# Patient Record
Sex: Female | Born: 1960 | Race: White | Hispanic: No | Marital: Married | Smoking: Never smoker
Health system: Southern US, Community
[De-identification: ages and names within clinical notes are randomized; demographics above are authoritative.]

## PROBLEM LIST (undated history)

## (undated) DIAGNOSIS — R109 Unspecified abdominal pain: Secondary | ICD-10-CM

## (undated) DIAGNOSIS — A029 Salmonella infection, unspecified: Secondary | ICD-10-CM

## (undated) DIAGNOSIS — F419 Anxiety disorder, unspecified: Secondary | ICD-10-CM

## (undated) DIAGNOSIS — N809 Endometriosis, unspecified: Secondary | ICD-10-CM

## (undated) HISTORY — PX: NASAL SINUS SURGERY: SHX719

## (undated) HISTORY — DX: Salmonella infection, unspecified: A02.9

## (undated) HISTORY — DX: Endometriosis, unspecified: N80.9

---

## 1997-11-23 ENCOUNTER — Other Ambulatory Visit: Admission: RE | Admit: 1997-11-23 | Discharge: 1997-11-23 | Payer: Self-pay | Admitting: *Deleted

## 1998-08-02 ENCOUNTER — Encounter: Payer: Self-pay | Admitting: *Deleted

## 1998-08-02 ENCOUNTER — Ambulatory Visit (HOSPITAL_COMMUNITY): Admission: RE | Admit: 1998-08-02 | Discharge: 1998-08-02 | Payer: Self-pay | Admitting: *Deleted

## 1998-12-04 ENCOUNTER — Other Ambulatory Visit: Admission: RE | Admit: 1998-12-04 | Discharge: 1998-12-04 | Payer: Self-pay | Admitting: *Deleted

## 1999-11-26 ENCOUNTER — Other Ambulatory Visit: Admission: RE | Admit: 1999-11-26 | Discharge: 1999-11-26 | Payer: Self-pay | Admitting: *Deleted

## 2000-11-28 ENCOUNTER — Other Ambulatory Visit: Admission: RE | Admit: 2000-11-28 | Discharge: 2000-11-28 | Payer: Self-pay | Admitting: *Deleted

## 2001-11-30 ENCOUNTER — Other Ambulatory Visit: Admission: RE | Admit: 2001-11-30 | Discharge: 2001-11-30 | Payer: Self-pay | Admitting: Obstetrics and Gynecology

## 2002-12-14 ENCOUNTER — Other Ambulatory Visit: Admission: RE | Admit: 2002-12-14 | Discharge: 2002-12-14 | Payer: Self-pay | Admitting: Obstetrics and Gynecology

## 2003-01-05 ENCOUNTER — Encounter (INDEPENDENT_AMBULATORY_CARE_PROVIDER_SITE_OTHER): Payer: Self-pay

## 2003-01-05 ENCOUNTER — Ambulatory Visit (HOSPITAL_COMMUNITY): Admission: RE | Admit: 2003-01-05 | Discharge: 2003-01-05 | Payer: Self-pay | Admitting: Obstetrics and Gynecology

## 2004-01-02 ENCOUNTER — Other Ambulatory Visit: Admission: RE | Admit: 2004-01-02 | Discharge: 2004-01-02 | Payer: Self-pay | Admitting: Obstetrics and Gynecology

## 2005-01-08 ENCOUNTER — Other Ambulatory Visit: Admission: RE | Admit: 2005-01-08 | Discharge: 2005-01-08 | Payer: Self-pay | Admitting: Obstetrics and Gynecology

## 2005-01-11 ENCOUNTER — Encounter: Admission: RE | Admit: 2005-01-11 | Discharge: 2005-01-11 | Payer: Self-pay | Admitting: Obstetrics and Gynecology

## 2005-09-20 ENCOUNTER — Ambulatory Visit: Payer: Self-pay | Admitting: Internal Medicine

## 2005-09-27 ENCOUNTER — Ambulatory Visit: Payer: Self-pay | Admitting: Internal Medicine

## 2007-12-09 ENCOUNTER — Encounter: Payer: Self-pay | Admitting: Internal Medicine

## 2008-04-27 ENCOUNTER — Ambulatory Visit: Payer: Self-pay | Admitting: Internal Medicine

## 2008-04-27 DIAGNOSIS — F411 Generalized anxiety disorder: Secondary | ICD-10-CM | POA: Insufficient documentation

## 2009-08-03 ENCOUNTER — Encounter: Payer: Self-pay | Admitting: Internal Medicine

## 2010-03-30 ENCOUNTER — Ambulatory Visit: Payer: Self-pay | Admitting: Internal Medicine

## 2010-04-03 ENCOUNTER — Telehealth: Payer: Self-pay | Admitting: Internal Medicine

## 2010-06-06 ENCOUNTER — Ambulatory Visit: Payer: Self-pay | Admitting: Internal Medicine

## 2010-06-06 DIAGNOSIS — M25559 Pain in unspecified hip: Secondary | ICD-10-CM | POA: Insufficient documentation

## 2010-06-06 DIAGNOSIS — R109 Unspecified abdominal pain: Secondary | ICD-10-CM | POA: Insufficient documentation

## 2010-06-11 ENCOUNTER — Telehealth: Payer: Self-pay | Admitting: Internal Medicine

## 2010-08-15 ENCOUNTER — Ambulatory Visit (HOSPITAL_COMMUNITY)
Admission: RE | Admit: 2010-08-15 | Discharge: 2010-08-15 | Payer: Self-pay | Source: Home / Self Care | Attending: Gynecologic Oncology | Admitting: Gynecologic Oncology

## 2010-08-15 ENCOUNTER — Ambulatory Visit
Admission: RE | Admit: 2010-08-15 | Discharge: 2010-08-15 | Payer: Self-pay | Source: Home / Self Care | Attending: Gynecologic Oncology | Admitting: Gynecologic Oncology

## 2010-08-16 NOTE — Consult Note (Signed)
Kathy Floyd, Kathy NO.:  [id number]  MEDICAL RECORD NO.:  [id number]          PATIENT TYPE:  OUT  LOCATION:  XRAY                         FACILITY:  [facility]  PHYSICIAN:  Laurette Schimke, MD     DATE OF BIRTH:  May 25, 1961  DATE OF CONSULTATION:  08/15/2010 DATE OF DISCHARGE:                                CONSULTATION   REASON FOR CONSULTATION:  Consult was requested by Dr. Henderson Cloud for management of a pelvic mass.  HISTORY OF PRESENT ILLNESS:  This is a 50 year old gravida 1, para 1, who noted the onset of left hip pain for which she sought evaluation. She was referred to an orthopedic surgeon and underwent an MRI of the pelvis, which demonstrated the presence of uterine fibroids, the largest of which measured 2.6 cm.  At that time, free and loculated fluid was noted within the pelvis and a pelvic sonogram was recommended.  Such, a sonogram was obtained on June 11, 2010, and at that time was notable for the presence of a 2.79 x 1.44 cm, left lateral posterior fibroid. The adnexa were small, the right ovary measuring 5.7 cm and the left ovary measuring 1.8 cm in greatest dimension.  There is no comment regarding presence of fluid.  A repeat ultrasound was ordered on July 05, 2010, and that ultrasound demonstrated a right simple cyst and 2 complex right ovarian masses, measuring 1.6 and 1.96 cm.  A CA125 was obtained and returned a value of 50, and the patient was referred to the office.  During this visit, we were able to facilitate another ultrasound, and this time on the ultrasound, the right adnexa was not appreciated.  Of note, the ultrasound in December was notable for a complex mass in the right ovary.  The left ovary demonstrated an amorphous mass/cyst with linear septations, questionable for an endometrioma.  Ms. Kathy Floyd denies any weight loss.  There is no bloating.  No changes in appetite.  No early satiety, alternating diarrhea, constipation,  or vaginal bleeding.  She also denies use of any other agents that may have affected the efficacy of her oral contraceptive pills and allowed for development of functional cyst.  Of note, she has been on continuous oral contraceptive pills for a long time for management of severe dysmenorrhea and symptoms associated with menses.  PAST MEDICAL HISTORY:  Anxiety for 5 years.  PAST SURGICAL HISTORY:  Denies.  PAST GYN HISTORY:  Menarche at age of 4 with regular menses.  Oral contraceptive pills since the age of 24; there was a break of about 2 years.  She is gravida 1, para 74, has a 30 year old son who is alive and well.  SOCIAL HISTORY:  She denies tobacco and reports occasional alcohol use. This is her second marriage and she is employed as a Radio producer.  MEDICATIONS:  Yasmin, Toprol, and lorazepam.  SCREENING HISTORY:  Mammogram in June 2011, within normal limits.  ALLERGIES:  SULFA, which causes hives.  REVIEW OF SYSTEMS:  Ten-point review of systems pertinent only as noted above.  PHYSICAL EXAMINATION:  GENERAL:  Well-developed female in no acute distress. VITAL SIGNS:  Weight 150 pounds,  height 5 feet 3 inches, blood pressure 118/70, pulse of 68. HEART:  Regular rate and rhythm. CHEST:  Clear to auscultation. BACK:  No CVA tenderness. ABDOMEN:  Soft, nontender, without any masses and no ascites or palpable omental cake. PELVIC:  Normal external genitalia, Bartholin's, urethra and Skene's. Normal-appearing cervix.  Mobile uterus.  No nodularity within the cul- de-sac.  No tenderness on pelvic examination. RECTAL:  Good anal sphincter tone without any masses.  IMPRESSION:  Ms. Kathy Floyd is a 50 year old on continuous oral contraceptive pills with CA125 elevated to a level of 50 and imaging which may be a red herring.  Of note, the imaging identifies adnexal masses that disappear each month.  The last ultrasound finding was suspicious of the presence of an  endometrioma and this may explain the minimal elevation of CA125.  As such, the recommendation was to obtain an MRI, which will be ordered.  If the abnormality on the left adnexa persists, then at minimum, a laparoscopic USO may be indicated.  Ms. Kathy Floyd was counseled regarding the benefits of maintenance of her ovaries with respect to quality of life and also length of life.  All of her questions were answered and our discussion was recorded by her stepmother.  We will contact her after the pelvic MRI, specific for soft tissue, to discuss further plan.     Laurette Schimke, MD     WB/MEDQ  D:  08/15/2010  T:  08/15/2010  Job:  [id number]  cc:   Telford Nab, R.N. [address] [place], [place] [address]  Carrington Clamp, M.D. Fax: [phone]  Electronically Signed by Laurette Schimke MD on 08/16/2010 02:38:37 PM

## 2010-08-21 ENCOUNTER — Ambulatory Visit (HOSPITAL_COMMUNITY)
Admission: RE | Admit: 2010-08-21 | Discharge: 2010-08-21 | Payer: Self-pay | Source: Home / Self Care | Attending: Gynecologic Oncology | Admitting: Gynecologic Oncology

## 2010-08-30 NOTE — Progress Notes (Signed)
Summary: Refill Request  Phone Note Refill Request Call back at Work Phone [phone] Message from:  Patient on June 11, 2010 10:51 AM  Refills Requested: Medication #1:  DICYCLOMINE HCL 20 MG TABS 1 as needed   Dosage confirmed as above?Dosage Confirmed   Supply Requested: 3 months   Last Refilled: 05/10/2008   Notes: 4 refills  Medication #2:  TRIAMCINOLONE ACETONIDE 0.1 % CREA use twice daily as needed.   Dosage confirmed as above?Dosage Confirmed   Supply Requested: 3 months   Last Refilled: 04/27/2008   Notes: 4 refills Patient would like to pick up paper rx's.   Next Appointment Scheduled: none Initial call taken by: Harold Barban,  June 11, 2010 10:51 AM  Follow-up for Phone Call        rx's ready. pt aware - request I mail to home address   KIK Follow-up by: Duard Brady LPN,  June 11, 2010 1:12 PM    Prescriptions: TRIAMCINOLONE ACETONIDE 0.1 % CREA (TRIAMCINOLONE ACETONIDE) use twice daily as needed  #60 gm x 2   Entered by:   Duard Brady LPN   Authorized by:   Gordy Savers  MD   Signed by:   Duard Brady LPN on 02/72/5366   Method used:   Print then Give to Patient   RxID:   [id number] DICYCLOMINE HCL 20 MG TABS (DICYCLOMINE HCL) 1 as needed  #90 x 3   Entered by:   Duard Brady LPN   Authorized by:   Gordy Savers  MD   Signed by:   Duard Brady LPN on 64/33/2951   Method used:   Print then Give to Patient   RxID:   [id number]

## 2010-08-30 NOTE — Assessment & Plan Note (Signed)
Summary: TETANUS INJ/NJR  Nurse Visit   Vital Signs:  Patient profile:   50 year old female Height:      64.5 inches Weight:      149 pounds Temp:     98.2 degrees F oral  Vitals Entered By: Duard Brady LPN (March 30, 2010 3:56 PM) CC: in for a tetnaus - last seen 2009 - per Dr. Amador Cunas ok to give - will need rov for any refills on meds.  Is Patient Diabetic? No   Allergies: 1)  ! Sulf-10  Immunizations Administered:  Tetanus Vaccine:    Vaccine Type: Tdap    Site: right deltoid    Mfr: Gl[employer]oSmithKline    Dose: 1.0 ml    Route: IM    Given by: Duard Brady LPN    Exp. Date: 04/18/2012    Lot #: [id number]    VIS given: 06/15/08 version given March 30, 2010.    Physician counseled: yes  Orders Added: 1)  Tdap => 2yrs IM [90715] 2)  Admin 1st Vaccine [90471] Instructed to make rov soon. KIK

## 2010-08-30 NOTE — Progress Notes (Signed)
Summary: tdap info  Phone Note Call from Patient   Caller: Patient Call For: Gordy Savers  MD Summary of Call: Pt is concerned that she got the wrong injection on Friday.  Please call her.  [phone]  Wants to speak to Selena Batten to see about the injection and the papers that were given to her with info about the injection. Initial call taken by: Lynann Beaver CMA,  April 03, 2010 9:03 AM  Follow-up for Phone Call        called pt - was given pedi tdap info sheet - , medication record checked - adult tdap was given. New info sheet mailed to pt as requested. KIK Follow-up by: Duard Brady LPN,  April 03, 2010 9:57 AM

## 2010-08-30 NOTE — Assessment & Plan Note (Signed)
Summary: MED CK / REFILL // RS----PT [facility] // RS   Vital Signs:  Patient profile:   50 year old female Weight:      147 pounds Temp:     97.9 degrees F oral BP sitting:   116 / 70  (left arm) Cuff size:   regular  Vitals Entered By: Duard Brady LPN (June 06, 2010 8:38 AM) [MLI_FORM:[id number]] [MLI_FORM:[id number]][MLI_FORM:[id number]][MLI_FORM:[id number]][MLI_FORM:[id number]][MLI_FORM:[id number]][MLI_FORM:[id number]][MLI_FORM:[id number]][MLI_FORM:[id number]] [MLI_FORM:[id number]][MLI_FORM:[id number]][MLI_FORM:[id number]][MLI_FORM:[id number]] [MLI_FORM:[id number]] [MLI_FORM:[id number]] [MLI_FORM:[id number]]  Appended Document: MED CK / REFILL // RS----PT [facility] // RS HPI-  50 year old patient who is seen today complaining of right hip pain; this has been a problem for a number of months.  She states that she has been treated for scoliosis in the past while in high school.  She states that she use a brace for a number of years.  She is requesting orthopedic referral  Examination- comfortable no distress LS-spine revealed no focal tenderness or spasm; no striking  Scoliosis appreciated extremities- range of motion of the hips appear to be intact.  Straight leg testing negative  Impression chronic right hip pain-, history of scoliosis plan Will set up for orthopedic referral.  Will treat with anti-inflammatory medication  Appended Document: MED CK / REFILL // RS----PT [facility] // RS     Allergies: 1)  ! Sulf-10   Complete Medication List: 1)  Toprol Xl 100 Mg Xr24h-tab (Metoprolol succinate) .... 1/2 - 1 once daily 2)  Dicyclomine Hcl 20 Mg Tabs (Dicyclomine hcl) .Marland Kitchen.. 1 as needed 3)  Lorazepam 0.5 Mg Tabs (Lorazepam) .Marland Kitchen.. 1 two times a day as needed 4)  Triamcinolone Acetonide 0.1 % Crea (Triamcinolone acetonide) .... Use twice daily as needed  Other Orders: UA Dipstick w/o Micro  (manual) (19147)   Orders Added: 1)  UA Dipstick w/o Micro (manual) [81002]      Laboratory Results   Urine Tests  Date/Time Received: 06/06/2010 Date/Time Reported: 06/06/2010  Routine Urinalysis   Color: yellow Appearance: Clear Glucose: negative   (Normal Range: Negative) Bilirubin: negative   (Normal Range: Negative) Ketone: negative   (Normal Range: Negative) Spec. Gravity: >=1.030   (Normal Range: 1.003-1.035) Blood: negative   (Normal Range: Negative) pH: 5.0   (Normal Range: 5.0-8.0) Protein: negative   (Normal Range: Negative) Urobilinogen: 0.2   (Normal Range: 0-1) Nitrite: negative   (Normal Range: Negative) Leukocyte Esterace: negative   (Normal Range: Negative)

## 2010-12-14 NOTE — Op Note (Signed)
NAME:  Kathy Floyd, Kathy Floyd                           ACCOUNT NO.:  [id number]   MEDICAL RECORD NO.:  [id number]                   PATIENT TYPE:  AMB   LOCATION:  SDC                                  FACILITY:  [facility]   PHYSICIAN:  Sherry A. Rosalio Macadamia, M.D.           DATE OF BIRTH:  September 15, 1960   DATE OF PROCEDURE:  01/05/2003  DATE OF DISCHARGE:                                 OPERATIVE REPORT   PREOPERATIVE DIAGNOSIS:  Menometrorrhagia.   POSTOPERATIVE DIAGNOSIS:  Menometrorrhagia.   PROCEDURE:  D&C hysteroscopy.   SURGEON:  Sherry A. Rosalio Macadamia, M.D.   ANESTHESIA:  MAC.   INDICATIONS FOR PROCEDURE:  This is a 50 year old G1, P1-0-0-1 woman who has  been on birth control pills for many years.  The patient has irregular  periods on the pills lasting four to five days.  However, the patient also  has continuous bleeding daily.  The patient was treated with extra estrogen  patches to try to control the breakthrough bleeding, however, she continued  to have this breakthrough bleeding. Because of this, ultrasound was  performed which revealed a normal uterus and normal endometrial cavity,  however, the endocervical lower uterine segment junction had some very  irregular tissue and it was felt that might be causing this irregular  bleeding.  Because of this, the patient is brought to the operating room for  [facility] hysteroscopy with resectoscope.   FINDINGS:  A normal sized anteflexed uterus, no adnexal mass.  Some slight  irregular tissue at the cervical junction.  Normal cornual regions.  Small  fundal perforation.   DESCRIPTION OF PROCEDURE:  The patient was brought into the operating room  and given adequate IV sedation.  She was placed in the dorsal lithotomy  position.  Her perineum and vagina were all washed with Hibiclens.  Pelvic  examination was performed.  Surgeon's gown and gloves were changed.  The  patient was draped in a sterile fashion.  Speculum placed in the vagina.  Paracervical block was administered with 1% Nesacaine.  Anterior lip of the  cervix was grasped with a single-tooth tenaculum.  The cervix was sounded.  Cervix was dilated with Shawnie Pons dilators initially starting at a #15 after the  initial dilatation.  It was felt that the fundus of the uterus may have been  perforated.  Dilatation increased to 10 and then very minimally to 21.  Decision was made not to perform the resectoscope, not to dilate her up to a  31 because of this potential perforation.  The small hysteroscope was  therefore introduced into the endometrial cavity.  Pictures were obtained.  There was a very small fundal perforation with a minimal amount of bleeding.  The tissue was seen circumferentially. Cornual regions were visualized.  The  Sorbitol was stopped to decrease pressure in the endometrium and the  perforated area was visualized with very minimal bleeding present.  The  hysteroscope  was removed.  Endocervical curettage was performed to the  internal os and using sharp curettage, a very gentle endometrial curettage  was performed not near the fundal region.  It was felt that adequate  hemostasis was present.  All instruments were removed from the vagina.  Although a perforation was present, it was felt that it was minimal and  because of its being in the midline and not near any arterial source, the  decision was made not to do any further surgery at this time.   The patient was taken out of the dorsal lithotomy position.  She was  awakened.  She was moved from the operating table to a stretcher in stable  condition.   COMPLICATIONS:  A small midline fundal perforation.   ESTIMATED BLOOD LOSS:  Less than 5 mL.   SORBITOL DIFFERENTIAL:  This was -100 mL.                                               Sherry A. Rosalio Macadamia, M.D.    SAD/MEDQ  D:  01/05/2003  T:  01/05/2003  Job:  [id number]

## 2011-03-18 ENCOUNTER — Other Ambulatory Visit: Payer: Self-pay | Admitting: Obstetrics and Gynecology

## 2011-03-20 ENCOUNTER — Encounter (HOSPITAL_COMMUNITY)
Admission: RE | Admit: 2011-03-20 | Discharge: 2011-03-20 | Disposition: A | Discharge status: Home / Self Care | Payer: PRIVATE HEALTH INSURANCE | Source: Ambulatory Visit | Attending: Obstetrics and Gynecology | Admitting: Obstetrics and Gynecology

## 2011-03-20 ENCOUNTER — Encounter (HOSPITAL_COMMUNITY): Payer: Self-pay

## 2011-03-20 HISTORY — DX: Anxiety disorder, unspecified: F41.9

## 2011-03-20 HISTORY — DX: Unspecified abdominal pain: R10.9

## 2011-03-20 LAB — SURGICAL PCR SCREEN: Staphylococcus aureus: NEGATIVE

## 2011-03-20 LAB — CBC
HCT: 36.8 % (ref 36.0–46.0)
Hemoglobin: 12.4 g/dL (ref 12.0–15.0)
RBC: 3.99 MIL/uL (ref 3.87–5.11)
RDW: 12.9 % (ref 11.5–15.5)
WBC: 7.8 10*3/uL (ref 4.0–10.5)

## 2011-03-20 NOTE — Patient Instructions (Addendum)
Kathy Floyd  03/20/2011   Your procedure is scheduled on:  03/27/11  Report to [facility] at 0900 AM. Desk phone # [phone]  Call this number if you have problems the morning of surgery: [phone]   Remember:   Do not eat food:After Midnight.  Do not drink clear liquids: After Midnight.  Take these medicines the morning of surgery with A SIP OF WATER: Toprol    Do not wear jewelry, make-up or nail polish.  Do not wear lotions, powders, or perfumes. You may wear deodorant.  Do not shave 48 hours prior to surgery.  Do not bring valuables to the hospital.  Contacts, dentures or bridgework may not be worn into surgery.  Leave suitcase in the car. After surgery it may be brought to your room.  For patients admitted to the hospital, checkout time is 11:00 AM the day of discharge.   Patients discharged the day of surgery will not be allowed to drive home.  Name and phone number of your driver: [phone]  Special Instructions: CHG Shower Use Special Wash: 1/2 bottle night before surgery and 1/2 bottle morning of surgery.   Please read over the following fact sheets that you were given

## 2011-03-22 ENCOUNTER — Other Ambulatory Visit: Payer: Self-pay | Admitting: Obstetrics and Gynecology

## 2011-03-26 NOTE — H&P (Signed)
50 y.o. yo complains of persistant LLQ pain and left hip pain.  Pt presented with LLQ pain 11-12 and had an U/S showing a simple 2-4 cm R ovarian cysts.  CA-125 was  50 and the patient was sent for a Gyn Onc consult.  Gyn Onc felt that cysts were benign and f/u ultrasounds were all that were needed.  F/u U/S showed continued cysts but no change and no increase in blood flow.  With her annual exam in July, the U/S showed no ovarian cysts and only  2-3 cm fibroid.  Pt has been dealing with hip pain and LLQ pain for over a year and desires a diagnostic laparoscopy for evaluation.  She denies abnormal bleeding.  Birth control pills seem to have resolved ovarian cysts but not pain.  Past Medical History  Diagnosis Date  . Anxiety   . Abdominal cramping     stress related per ppt   Past Surgical History  Procedure Date  . Nasal sinus surgery     x2     No current facility-administered medications on file prior to encounter.   No current outpatient prescriptions on file prior to encounter.    Allergies  Allergen Reactions  . Sulfacetamide Sodium Rash    Vitals pending.  Lungs: clear to ascultation Cor:  RRR Abdomen:  soft, nontender, nondistended. Ex:  no cords, erythema Pelvic:  NEFG, tender exam but no CMT or masses felt.  A:  Pt was given several options.  She elected to undergo a diagnostic laparoscopy with possible Robotic resection and cautery of endometriosis or adhesions.   P:  For above named procedures.   All risks, benefits and alternatives d/w patient and she desires to proceed .  Patient has undergone a modified bowel prep and will receive preop antibiotics and SCDs during the operation.     HORVATH,MICHELLE A

## 2011-03-27 ENCOUNTER — Encounter (HOSPITAL_COMMUNITY): Payer: Self-pay | Admitting: Anesthesiology

## 2011-03-27 ENCOUNTER — Ambulatory Visit (HOSPITAL_COMMUNITY): Payer: PRIVATE HEALTH INSURANCE | Admitting: Anesthesiology

## 2011-03-27 ENCOUNTER — Ambulatory Visit (HOSPITAL_COMMUNITY)
Admission: RE | Admit: 2011-03-27 | Discharge: 2011-03-27 | Disposition: A | Discharge status: Home / Self Care | Payer: PRIVATE HEALTH INSURANCE | Source: Ambulatory Visit | Attending: Obstetrics and Gynecology | Admitting: Obstetrics and Gynecology

## 2011-03-27 ENCOUNTER — Other Ambulatory Visit: Payer: Self-pay | Admitting: Obstetrics and Gynecology

## 2011-03-27 ENCOUNTER — Encounter (HOSPITAL_COMMUNITY)
Admission: RE | Disposition: A | Discharge status: Home / Self Care | Payer: Self-pay | Source: Ambulatory Visit | Attending: Obstetrics and Gynecology

## 2011-03-27 DIAGNOSIS — R109 Unspecified abdominal pain: Secondary | ICD-10-CM

## 2011-03-27 DIAGNOSIS — N949 Unspecified condition associated with female genital organs and menstrual cycle: Secondary | ICD-10-CM | POA: Insufficient documentation

## 2011-03-27 LAB — HCG, SERUM, QUALITATIVE: Preg, Serum: NEGATIVE

## 2011-03-27 SURGERY — ROBOTIC ASSISTED LAPAROSCOPIC LYSIS OF ADHESION
Anesthesia: General

## 2011-03-27 MED ORDER — MORPHINE SULFATE 2 MG/ML IJ SOLN: 0.0500 mg/kg | INTRAMUSCULAR | Status: DC | PRN

## 2011-03-27 MED ORDER — ONDANSETRON HCL 4 MG/2ML IJ SOLN
INTRAMUSCULAR | Status: DC | PRN
  Administered 2011-03-27: 4 mg via INTRAVENOUS

## 2011-03-27 MED ORDER — DEXAMETHASONE SODIUM PHOSPHATE 4 MG/ML IJ SOLN
INTRAMUSCULAR | Status: DC | PRN
  Administered 2011-03-27: 10 mg via INTRAVENOUS

## 2011-03-27 MED ORDER — LIDOCAINE HCL (CARDIAC) 20 MG/ML IV SOLN
INTRAVENOUS | Status: AC
  Filled 2011-03-27: qty 5

## 2011-03-27 MED ORDER — MIDAZOLAM HCL 5 MG/5ML IJ SOLN
INTRAMUSCULAR | Status: DC | PRN
  Administered 2011-03-27: 2 mg via INTRAVENOUS

## 2011-03-27 MED ORDER — ROCURONIUM BROMIDE 50 MG/5ML IV SOLN
INTRAVENOUS | Status: AC
  Filled 2011-03-27: qty 1

## 2011-03-27 MED ORDER — MIDAZOLAM HCL 2 MG/2ML IJ SOLN
INTRAMUSCULAR | Status: AC
  Filled 2011-03-27: qty 2

## 2011-03-27 MED ORDER — GLYCOPYRROLATE 0.2 MG/ML IJ SOLN
INTRAMUSCULAR | Status: AC
  Filled 2011-03-27: qty 2

## 2011-03-27 MED ORDER — PHENYLEPHRINE HCL 10 MG/ML IJ SOLN
INTRAMUSCULAR | Status: DC | PRN
  Administered 2011-03-27: .8 mg via INTRAVENOUS
  Administered 2011-03-27: .04 mg via INTRAVENOUS

## 2011-03-27 MED ORDER — DEXAMETHASONE SODIUM PHOSPHATE 10 MG/ML IJ SOLN
INTRAMUSCULAR | Status: AC
  Filled 2011-03-27: qty 1

## 2011-03-27 MED ORDER — FENTANYL CITRATE 0.05 MG/ML IJ SOLN
INTRAMUSCULAR | Status: AC
  Filled 2011-03-27: qty 2

## 2011-03-27 MED ORDER — ONDANSETRON HCL 4 MG PO TABS
ORAL_TABLET | ORAL | Status: AC
  Administered 2011-03-27: 4 mg via ORAL
  Filled 2011-03-27: qty 1

## 2011-03-27 MED ORDER — ONDANSETRON HCL 4 MG/2ML IJ SOLN
INTRAMUSCULAR | Status: AC
  Filled 2011-03-27: qty 2

## 2011-03-27 MED ORDER — NEOSTIGMINE METHYLSULFATE 1 MG/ML IJ SOLN
INTRAMUSCULAR | Status: DC | PRN
  Administered 2011-03-27: 3 mg via INTRAMUSCULAR

## 2011-03-27 MED ORDER — ONDANSETRON HCL 4 MG PO TABS
4.0000 mg | ORAL_TABLET | Freq: Once | ORAL | Status: AC
  Administered 2011-03-27: 4 mg via ORAL

## 2011-03-27 MED ORDER — FENTANYL CITRATE 0.05 MG/ML IJ SOLN: 25.0000 ug | INTRAMUSCULAR | Status: DC | PRN

## 2011-03-27 MED ORDER — FENTANYL CITRATE 0.05 MG/ML IJ SOLN
INTRAMUSCULAR | Status: AC
  Administered 2011-03-27: 50 ug via INTRAMUSCULAR
  Filled 2011-03-27: qty 2

## 2011-03-27 MED ORDER — OXYCODONE-ACETAMINOPHEN 5-325 MG PO TABS: 1.0000 | ORAL_TABLET | ORAL | Status: AC | PRN

## 2011-03-27 MED ORDER — LIDOCAINE HCL (CARDIAC) 20 MG/ML IV SOLN
INTRAVENOUS | Status: DC | PRN
  Administered 2011-03-27: 80 mg via INTRAVENOUS

## 2011-03-27 MED ORDER — KETOROLAC TROMETHAMINE 30 MG/ML IJ SOLN
INTRAMUSCULAR | Status: DC | PRN
  Administered 2011-03-27: 60 mg via INTRAVENOUS

## 2011-03-27 MED ORDER — GLYCOPYRROLATE 0.2 MG/ML IJ SOLN
INTRAMUSCULAR | Status: DC | PRN
  Administered 2011-03-27: .6 mg via INTRAVENOUS
  Administered 2011-03-27: .2 mg via INTRAVENOUS

## 2011-03-27 MED ORDER — KETOROLAC TROMETHAMINE 60 MG/2ML IM SOLN
INTRAMUSCULAR | Status: AC
  Filled 2011-03-27: qty 2

## 2011-03-27 MED ORDER — ROCURONIUM BROMIDE 100 MG/10ML IV SOLN
INTRAVENOUS | Status: DC | PRN
  Administered 2011-03-27: 45 mg via INTRAVENOUS
  Administered 2011-03-27: 5 mg via INTRAVENOUS

## 2011-03-27 MED ORDER — PROPOFOL 10 MG/ML IV EMUL
INTRAVENOUS | Status: DC | PRN
  Administered 2011-03-27: 150 mg via INTRAVENOUS

## 2011-03-27 MED ORDER — FENTANYL CITRATE 0.05 MG/ML IJ SOLN
INTRAMUSCULAR | Status: DC | PRN
  Administered 2011-03-27: 100 ug via INTRAVENOUS
  Administered 2011-03-27 (×2): 50 ug via INTRAVENOUS
  Administered 2011-03-27: 100 ug via INTRAVENOUS
  Administered 2011-03-27: 50 ug via INTRAVENOUS

## 2011-03-27 MED ORDER — FENTANYL CITRATE 0.05 MG/ML IJ SOLN
INTRAMUSCULAR | Status: AC
  Filled 2011-03-27: qty 5

## 2011-03-27 MED ORDER — NEOSTIGMINE METHYLSULFATE 1 MG/ML IJ SOLN
INTRAMUSCULAR | Status: AC
  Filled 2011-03-27: qty 10

## 2011-03-27 MED ORDER — LACTATED RINGERS IV SOLN
INTRAVENOUS | Status: DC
  Administered 2011-03-27 (×3): via INTRAVENOUS

## 2011-03-27 SURGICAL SUPPLY — 46 items
BARRIER ADHS 3X4 INTERCEED (GAUZE/BANDAGES/DRESSINGS) ×3 IMPLANT
BRR ADH 4X3 ABS CNTRL BYND (GAUZE/BANDAGES/DRESSINGS) ×2
CABLE HIGH FREQUENCY MONO STRZ (ELECTRODE) ×2 IMPLANT
CATH ROBINSON RED A/P 16FR (CATHETERS) ×2 IMPLANT
CHLORAPREP W/TINT 26ML (MISCELLANEOUS) ×2 IMPLANT
CLOTH BEACON ORANGE TIMEOUT ST (SAFETY) ×2 IMPLANT
CONT PATH 16OZ SNAP LID 3702 (MISCELLANEOUS) ×2 IMPLANT
COVER MAYO STAND STRL (DRAPES) ×2 IMPLANT
COVER TABLE BACK 60X90 (DRAPES) ×4 IMPLANT
COVER TIP SHEARS 8 DVNC (MISCELLANEOUS) ×1 IMPLANT
COVER TIP SHEARS 8MM DA VINCI (MISCELLANEOUS) ×1
DERMABOND ADVANCED (GAUZE/BANDAGES/DRESSINGS) ×2 IMPLANT
DRAPE HUG U DISPOSABLE (DRAPE) ×2 IMPLANT
DRAPE HYSTEROSCOPY (DRAPE) ×2 IMPLANT
DRAPE LG THREE QUARTER DISP (DRAPES) ×4 IMPLANT
DRAPE MONITOR DA VINCI (DRAPE) ×2 IMPLANT
DRAPE WARM FLUID 44X44 (DRAPE) ×2 IMPLANT
ELECT REM PT RETURN 9FT ADLT (ELECTROSURGICAL) ×2
ELECTRODE REM PT RTRN 9FT ADLT (ELECTROSURGICAL) ×1 IMPLANT
EVACUATOR SMOKE 8.L (FILTER) ×2 IMPLANT
GLOVE BIO SURGEON STRL SZ7 (GLOVE) ×6 IMPLANT
GOWN PREVENTION PLUS LG XLONG (DISPOSABLE) ×8 IMPLANT
KIT DISP ACCESSORY 4 ARM (KITS) ×2 IMPLANT
NDL INSUFFLATION 14GA 120MM (NEEDLE) ×1 IMPLANT
NEEDLE INSUFFLATION 14GA 120MM (NEEDLE) ×2 IMPLANT
NS IRRIG 1000ML POUR BTL (IV SOLUTION) ×6 IMPLANT
PACK LAVH (CUSTOM PROCEDURE TRAY) ×2 IMPLANT
PAD PREP 24X48 CUFFED NSTRL (MISCELLANEOUS) ×4 IMPLANT
POSITIONER SURGICAL ARM (MISCELLANEOUS) ×4 IMPLANT
SET IRRIG TUBING LAPAROSCOPIC (IRRIGATION / IRRIGATOR) ×2 IMPLANT
SOLUTION ELECTROLUBE (MISCELLANEOUS) ×2 IMPLANT
SUT VIC AB 2-0 CT2 27 (SUTURE) ×4 IMPLANT
SUT VIC AB 2-0 UR5 27 (SUTURE) ×4 IMPLANT
SUT VICRYL 0 UR6 27IN ABS (SUTURE) ×4 IMPLANT
SUT VICRYL 3 0 RAPIDE (SUTURE) ×2 IMPLANT
SUT VICRYL RAPIDE 3 0 (SUTURE) ×4 IMPLANT
SYR 50ML LL SCALE MARK (SYRINGE) ×2 IMPLANT
TOWEL OR 17X24 6PK STRL BLUE (TOWEL DISPOSABLE) ×4 IMPLANT
TRAY FOLEY BAG SILVER LF 14FR (CATHETERS) ×2 IMPLANT
TROCAR DISP BLADELESS 8 DVNC (TROCAR) ×1 IMPLANT
TROCAR DISP BLADELESS 8MM (TROCAR) ×1
TROCAR XCEL NON-BLD 5MMX100MML (ENDOMECHANICALS) ×1 IMPLANT
TROCAR Z-THREAD 12X150 (TROCAR) IMPLANT
TUBING FILTER THERMOFLATOR (ELECTROSURGICAL) ×2 IMPLANT
WARMER LAPAROSCOPE (MISCELLANEOUS) ×2 IMPLANT
WATER STERILE IRR 1000ML POUR (IV SOLUTION) ×2 IMPLANT

## 2011-03-27 NOTE — Brief Op Note (Addendum)
03/27/2011  2:03 PM  PATIENT:  Kathy Floyd  49 y.o. female  PRE-OPERATIVE DIAGNOSIS:  Pelvic Pain  POST-OPERATIVE DIAGNOSIS:  Pelvic Pain  PROCEDURE:  Procedure(s): ROBOTIC ASSISTED LAPAROSCOPIC LYSIS OF ADHESION  SURGEON:  Surgeon(s): Michelle A Horvath W Scott Bowie  PHYSICIAN ASSISTANT:   ASSISTANTS: Dr. Bowie   ANESTHESIA:   general  ESTIMATED BLOOD LOSS: minimal  IVF:  2000 cc  UOP:  25 cc clear  BLOOD ADMINISTERED:none  DRAINS: none   MEDICATIONS USED:  Interceed  SPECIMEN:  Source of Specimen:  multiple peritoneal biopsies  DISPOSITION OF SPECIMEN:  PATHOLOGY  COUNTS:  YES  PATIENT DISPOSITION:  PACU - hemodynamically stable.   Delay start of Pharmacological VTE agent (>24hrs) due to surgical blood loss or risk of bleeding:  not applicable  Complications:  None.  Findings:  Filmy adhesions tethering the anterior abdominal wall to the fundus to the sigmoid.  About a 4 cm fibroid in the left cornua.  Adhesions of the sigmoid and large intestine to the left cul de sac in between the utero-sacrals.  An adhesion to a small superficial peritoneal mass under the right ovary.  The mass appeared to be endometriosis.  Adhesions of the right colon to the anterior mid right abdominal wall.  Liver edge, appendix, ovaries and tubes were otherwise normal.  The ureters were seen well out of all fields of dissection.  Technique:  After adequate general anesthesia was achieved, the patient was prepped and draped in sterile fashion.  The speculum was placed into the vagina and the uterine manipulator placed in the cervix. The speculum was removed and a catheter placed to drain the bladder during the surgery.  Attention was turned to the abdomen and a  2 cm incision was made above the umbilicus.   The veress needle passed into the abdomen without aspiration of bowel contents or blood.  The 12 mm trocar for the camera port was introduced after insuflatation and the above  findings noted.  Two 8.5 mm trocars were introduced 10 cm either side of the camera port and the 5 mm assist introduced 3 cm above the iliac crest, all under direct visualization.  The PK forceps were introduced on arm 2 and the Hot Shears on arm 1.  The adhesion of the anterior uterus to the anterior abdominal wall and to the sigmoid was removed with the hot shears and sent to pathology.  The small mass (endometriosis?) adhesed to the bowel was removed carefully off the peritoneum in the cul de sac, well away from the ureter.  The adhesions of the sigmoid and large colon to the left cul de sac were removed with careful blunt and sharp dissection in the filmy parts.  A small amount of cautery on the epiploica was used to achieve hemostasis.  The adhesion of the right large bowel to the mid anterior abdominal wall was removed with sharp dissection and careful cautery.  A needle driver replaced the hot shears and Interceed and a stitch of 3- Vicryl Rapide were introduced in through the camera port.  The Interceed was laid over the uterus and the area of the fibroid and adhesion and sewn with two stitches into the anterior peritoneum.  The Interceed was tethered to the posterior serosa of the uterus with a stitch.  A second stitch in this area was done because of some serosal tearing and bleeding secondary to the first stitch.  PK cautery ensured hemostasis.  A second piece of Interceed was   placed in the cul de sac over the raw areas.  All instruments were removed and the abdomen desuflated.  The 12 mm trocar site fascia was closed with a figure of eight stitch of 2-Vicryl.  All skin incisions were closed with subcuticular stitches and Dermabond.  All instruments were withdrawn from the vagina.  Pt tolerated the procedure well and was returned to the recovery room in stable condition.    HORVATH,MICHELLE A  

## 2011-03-27 NOTE — Anesthesia Postprocedure Evaluation (Signed)
Anesthesia Post Note  Patient: Kathy Floyd  Procedure(s) Performed:  ROBOTIC ASSISTED LAPAROSCOPIC LYSIS OF ADHESION - cautery of resection and endometriosis  Anesthesia type: General  Patient location: PACU  Post pain: Pain level controlled  Post assessment: Post-op Vital signs reviewed  Last Vitals:  Filed Vitals:   03/27/11 1445  BP: 100/47  Pulse: 65  Temp:   Resp: 18    Post vital signs: Reviewed  Level of consciousness: sedated  Complications: No apparent anesthesia complicationsfj

## 2011-03-27 NOTE — Anesthesia Preprocedure Evaluation (Signed)
Anesthesia Evaluation  Name, MR# and DOB Patient awake  General Assessment Comment  Reviewed: Allergy & Precautions, H&P , Patient's Chart, lab work & pertinent test results, reviewed documented beta blocker date and time   Airway Mallampati: II TM Distance: >3 FB Neck ROM: full    Dental No notable dental hx. (+) Teeth Intact   Pulmonary  clear to auscultation  pulmonary exam normalPulmonary Exam Normal breath sounds clear to auscultation none    Cardiovascular hypertension (BBlocker for anxiety)regular Normal    Neuro/Psych   GI/Hepatic/Renal   Endo/Other    Abdominal   Musculoskeletal   Hematology   Peds  Reproductive/Obstetrics    Anesthesia Other Findings             Anesthesia Physical Anesthesia Plan  ASA: I  Anesthesia Plan: General   Post-op Pain Management:    Induction: Intravenous  Airway Management Planned: Oral ETT  Additional Equipment:   Intra-op Plan:   Post-operative Plan:   Informed Consent: I have reviewed the patients History and Physical, chart, labs and discussed the procedure including the risks, benefits and alternatives for the proposed anesthesia with the patient or authorized representative who has indicated his/her understanding and acceptance.   Dental Advisory Given  Plan Discussed with: CRNA and Surgeon  Anesthesia Plan Comments: (  Discussed  general anesthesia, including possible nausea, instrumentation of airway, sore throat,pulmonary aspiration, etc. I asked if the were any outstanding questions, or  concerns before we proceeded. )        Anesthesia Quick Evaluation

## 2011-03-27 NOTE — Progress Notes (Signed)
There has been no change in the patients history or status since the history and physical.  Filed Vitals:   03/27/11 1003  BP: 90/43  Pulse: 84  Temp: 98.1 F (36.7 C)  TempSrc: Oral  Resp: 18  SpO2: 99%    Lab Results  Component Value Date   WBC 7.8 03/20/2011   HGB 12.4 03/20/2011   HCT 36.8 03/20/2011   MCV 92.2 03/20/2011   PLT 360 03/20/2011    HORVATH,MICHELLE A

## 2011-03-27 NOTE — Preoperative (Signed)
Beta Blockers   Reason not to administer Beta Blockers:patient took prescribed medication at home prior to admit

## 2011-03-27 NOTE — Transfer of Care (Signed)
  Anesthesia Post-op Note  Patient: Kathy Floyd  Procedure(s) Performed:  ROBOTIC ASSISTED LAPAROSCOPIC LYSIS OF ADHESION - cautery of resection and endometriosis  Patient Location: PACU  Anesthesia Type: General  Level of Consciousness: awake, alert , oriented and patient cooperative  Airway and Oxygen Therapy: Patient Spontanous Breathing and Patient connected to nasal cannula oxygen  Post-op Pain: mild  Post-op Assessment: Post-op Vital signs reviewed, Patient's Cardiovascular Status Stable, Respiratory Function Stable, Patent Airway and No signs of Nausea or vomiting  Post-op Vital Signs: Reviewed and stable  Complications: No apparent anesthesia complications

## 2011-03-27 NOTE — Op Note (Signed)
03/27/2011  2:03 PM  PATIENT:  Kathy Floyd  50 y.o. female  PRE-OPERATIVE DIAGNOSIS:  Pelvic Pain  POST-OPERATIVE DIAGNOSIS:  Pelvic Pain  PROCEDURE:  Procedure(s): ROBOTIC ASSISTED LAPAROSCOPIC LYSIS OF ADHESION  SURGEON:  Surgeon(s): Elray Mcgregor  PHYSICIAN ASSISTANT:   ASSISTANTS: Dr. Greta Doom   ANESTHESIA:   general  ESTIMATED BLOOD LOSS: minimal  IVF:  2000 cc  UOP:  25 cc clear  BLOOD ADMINISTERED:none  DRAINS: none   MEDICATIONS USED:  Interceed  SPECIMEN:  Source of Specimen:  multiple peritoneal biopsies  DISPOSITION OF SPECIMEN:  PATHOLOGY  COUNTS:  YES  PATIENT DISPOSITION:  PACU - hemodynamically stable.   Delay start of Pharmacological VTE agent (>24hrs) due to surgical blood loss or risk of bleeding:  not applicable  Complications:  None.  Findings:  Filmy adhesions tethering the anterior abdominal wall to the fundus to the sigmoid.  About a 4 cm fibroid in the left cornua.  Adhesions of the sigmoid and large intestine to the left cul de sac in between the utero-sacrals.  An adhesion to a small superficial peritoneal mass under the right ovary.  The mass appeared to be endometriosis.  Adhesions of the right colon to the anterior mid right abdominal wall.  Liver edge, appendix, ovaries and tubes were otherwise normal.  The ureters were seen well out of all fields of dissection.  Technique:  After adequate general anesthesia was achieved, the patient was prepped and draped in sterile fashion.  The speculum was placed into the vagina and the uterine manipulator placed in the cervix. The speculum was removed and a catheter placed to drain the bladder during the surgery.  Attention was turned to the abdomen and a  2 cm incision was made above the umbilicus.   The veress needle passed into the abdomen without aspiration of bowel contents or blood.  The 12 mm trocar for the camera port was introduced after insuflatation and the above  findings noted.  Two 8.5 mm trocars were introduced 10 cm either side of the camera port and the 5 mm assist introduced 3 cm above the iliac crest, all under direct visualization.  The PK forceps were introduced on arm 2 and the Hot Shears on arm 1.  The adhesion of the anterior uterus to the anterior abdominal wall and to the sigmoid was removed with the hot shears and sent to pathology.  The small mass (endometriosis?) adhesed to the bowel was removed carefully off the peritoneum in the cul de sac, well away from the ureter.  The adhesions of the sigmoid and large colon to the left cul de sac were removed with careful blunt and sharp dissection in the filmy parts.  A small amount of cautery on the epiploica was used to achieve hemostasis.  The adhesion of the right large bowel to the mid anterior abdominal wall was removed with sharp dissection and careful cautery.  A needle driver replaced the hot shears and Interceed and a stitch of 3- Vicryl Rapide were introduced in through the camera port.  The Interceed was laid over the uterus and the area of the fibroid and adhesion and sewn with two stitches into the anterior peritoneum.  The Interceed was tethered to the posterior serosa of the uterus with a stitch.  A second stitch in this area was done because of some serosal tearing and bleeding secondary to the first stitch.  PK cautery ensured hemostasis.  A second piece of Interceed was  placed in the cul de sac over the raw areas.  All instruments were removed and the abdomen desuflated.  The 12 mm trocar site fascia was closed with a figure of eight stitch of 2-Vicryl.  All skin incisions were closed with subcuticular stitches and Dermabond.  All instruments were withdrawn from the vagina.  Pt tolerated the procedure well and was returned to the recovery room in stable condition.    HORVATH,MICHELLE A

## 2011-03-29 ENCOUNTER — Encounter: Payer: Self-pay | Admitting: Internal Medicine

## 2011-03-29 ENCOUNTER — Ambulatory Visit (INDEPENDENT_AMBULATORY_CARE_PROVIDER_SITE_OTHER): Payer: PRIVATE HEALTH INSURANCE | Admitting: Internal Medicine

## 2011-03-29 ENCOUNTER — Inpatient Hospital Stay (HOSPITAL_COMMUNITY)
Admission: EM | Admit: 2011-03-29 | Discharge: 2011-04-02 | DRG: 372 | Disposition: A | Discharge status: Home / Self Care | Payer: PRIVATE HEALTH INSURANCE | Attending: Internal Medicine | Admitting: Internal Medicine

## 2011-03-29 VITALS — BP 70/40 | Temp 97.7°F | Wt 146.0 lb

## 2011-03-29 DIAGNOSIS — A020 Salmonella enteritis: Principal | ICD-10-CM | POA: Diagnosis present

## 2011-03-29 DIAGNOSIS — R0902 Hypoxemia: Secondary | ICD-10-CM | POA: Diagnosis not present

## 2011-03-29 DIAGNOSIS — J9819 Other pulmonary collapse: Secondary | ICD-10-CM | POA: Diagnosis present

## 2011-03-29 DIAGNOSIS — R509 Fever, unspecified: Secondary | ICD-10-CM | POA: Diagnosis present

## 2011-03-29 DIAGNOSIS — D6489 Other specified anemias: Secondary | ICD-10-CM | POA: Diagnosis present

## 2011-03-29 DIAGNOSIS — R197 Diarrhea, unspecified: Secondary | ICD-10-CM

## 2011-03-29 DIAGNOSIS — R000 Tachycardia, unspecified: Secondary | ICD-10-CM | POA: Diagnosis present

## 2011-03-29 DIAGNOSIS — Z9889 Other specified postprocedural states: Secondary | ICD-10-CM

## 2011-03-29 DIAGNOSIS — E860 Dehydration: Secondary | ICD-10-CM | POA: Diagnosis present

## 2011-03-29 DIAGNOSIS — N949 Unspecified condition associated with female genital organs and menstrual cycle: Secondary | ICD-10-CM | POA: Diagnosis present

## 2011-03-29 DIAGNOSIS — G8929 Other chronic pain: Secondary | ICD-10-CM | POA: Diagnosis present

## 2011-03-29 DIAGNOSIS — I959 Hypotension, unspecified: Secondary | ICD-10-CM | POA: Diagnosis present

## 2011-03-29 DIAGNOSIS — E876 Hypokalemia: Secondary | ICD-10-CM | POA: Diagnosis present

## 2011-03-29 LAB — COMPREHENSIVE METABOLIC PANEL
BUN: 11 mg/dL (ref 6–23)
CO2: 23 mEq/L (ref 19–32)
Chloride: 101 mEq/L (ref 96–112)
Creatinine, Ser: 0.94 mg/dL (ref 0.50–1.10)
GFR calc non Af Amer: >60 mL/min (ref >60)
Total Bilirubin: 0.4 mg/dL (ref 0.3–1.2)

## 2011-03-29 LAB — URINE MICROSCOPIC-ADD ON

## 2011-03-29 LAB — CLOSTRIDIUM DIFFICILE BY PCR: Toxigenic C. Difficile by PCR: NEGATIVE

## 2011-03-29 LAB — CBC
HCT: 35.3 % — ABNORMAL LOW (ref 36.0–46.0)
Platelets: 262 10*3/uL (ref 150–400)
RDW: 13.1 % (ref 11.5–15.5)
WBC: 9.6 10*3/uL (ref 4.0–10.5)

## 2011-03-29 LAB — URINALYSIS, ROUTINE W REFLEX MICROSCOPIC
Nitrite: NEGATIVE
Specific Gravity, Urine: 1.013 (ref 1.005–1.030)
Urobilinogen, UA: 0.2 mg/dL (ref 0.0–1.0)

## 2011-03-29 LAB — LACTIC ACID, PLASMA: Lactic Acid, Venous: 1.6 mmol/L (ref 0.5–2.2)

## 2011-03-29 NOTE — Patient Instructions (Signed)
Report  to the emergency room immediately for IV fluids and consideration of admission

## 2011-03-29 NOTE — Progress Notes (Signed)
Subjective:    Patient ID: Kathy Floyd, female    DOB: [birth year], 50 y.o.   MRN: [id number]  HPI 51 year old patient who is seen today with a chief complaint of diarrhea. This began 7 days ago and this was fairly mild 4 days ago she left work due to chills and worsening diarrhea and was unable to work the following day. 2 days ago she underwent robotic-assisted laparoscopic lysis of adhesions the 2 pelvic pain performed by Dr. Henderson Cloud. Her diarrhea was much improved at that time. Findings included adhesions to the anterior abdominal wall into the fundus and the sigmoid. Multiple lesions were lysed. In the past 2 days she has had profuse diarrhea. She slept poorly through the night due to frequent diarrhea associated with urgency and incontinence. She has become weaker  with worsening dizziness. She has been able to tolerate clear liquids. No further fever or chills. Diarrhea is described as watery and green in color. Evaluation today revealed her to be hypotensive with blood pressure as low as 70/40. She has been on chronic beta blocker therapy due to due primarily an anxiety disorder. She has remained on this postoperatively. She is admitted to  the hospital for further evaluation and treatment of her hypotension thought secondary to volume depletion.   Current Allergies:  ! SULF-10 (SULFACETAMIDE SODIUM)   Past Medical History:  Anxiety   Past Surgical History:  Reviewed history and no changes required:  Sinus surgery times 2  D&C  gravida one, para one, abortus zero   Family History:  father history of arthritis, coronary artery disease  paternal grandfather MI  One brother in good health   Social History:  Married   Past Medical History  Diagnosis Date  . Anxiety   . Abdominal cramping     stress related per ppt   Past Surgical History  Procedure Date  . Nasal sinus surgery     x2    reports that she has never smoked. She has never used smokeless tobacco. She reports  that she drinks alcohol. She reports that she does not use illicit drugs. family history is not on file. Allergies  Allergen Reactions  . Sulfacetamide Sodium Rash     Review of Systems  Constitutional: Negative for fever, appetite change, fatigue and unexpected weight change.  HENT: Negative for hearing loss, ear pain, nosebleeds, congestion, sore throat, mouth sores, trouble swallowing, neck stiffness, dental problem, voice change, sinus pressure and tinnitus.   Eyes: Negative for photophobia, pain, redness and visual disturbance.  Respiratory: Negative for cough, chest tightness and shortness of breath.   Cardiovascular: Negative for chest pain, palpitations and leg swelling.  Gastrointestinal: Positive for abdominal pain and diarrhea. Negative for nausea, vomiting, constipation, blood in stool, abdominal distention and rectal pain.  Genitourinary: Negative for dysuria, urgency, frequency, hematuria, flank pain, vaginal bleeding, vaginal discharge, difficulty urinating, genital sores, vaginal pain, menstrual problem and pelvic pain.  Musculoskeletal: Negative for back pain and arthralgias.  Skin: Negative for rash.  Neurological: Positive for weakness and light-headedness. Negative for dizziness, syncope, speech difficulty, numbness and headaches.  Hematological: Negative for adenopathy. Does not bruise/bleed easily.  Psychiatric/Behavioral: Negative for suicidal ideas, behavioral problems, self-injury, dysphoric mood and agitation. The patient is not nervous/anxious.        Objective:   Physical Exam  Constitutional: She is oriented to person, place, and time. She appears well-developed and well-nourished.       Uncomfortable alert. Blood pressure 70-80/40-50 temperature  97.7; weight 146  HENT:  Head: Normocephalic.  Right Ear: External ear normal.  Left Ear: External ear normal.  Mouth/Throat: Oropharynx is clear and moist.  Eyes: Conjunctivae and EOM are normal. Pupils are  equal, round, and reactive to light.  Neck: Normal range of motion. Neck supple. No thyromegaly present.  Cardiovascular: Normal rate, regular rhythm, normal heart sounds and intact distal pulses.   Pulmonary/Chest: Effort normal and breath sounds normal.  Abdominal: Soft. Bowel sounds are normal. She exhibits no mass. There is tenderness. There is rebound and guarding.       Mildly distended with active bowel sounds Fresh laparoscopic scars noted Diffusely tender with significant rebound tenderness  Musculoskeletal: Normal range of motion.  Lymphadenopathy:    She has no cervical adenopathy.  Neurological: She is alert and oriented to person, place, and time.  Skin: Skin is warm and dry. No rash noted.  Psychiatric: She has a normal mood and affect. Her behavior is normal.          Assessment & Plan:  Hypotension secondary to volume depletion. Aggravating by beta blocker therapy Abdominal pain with rebound tenderness status post laparoscopic surgery with lysis of adhesions 2 days ago Anxiety disorder Profuse diarrhea  The patient will be admitted to hospital for volume repletion and further diagnostic studies. Beta blocker therapy will be held

## 2011-03-30 ENCOUNTER — Observation Stay (HOSPITAL_COMMUNITY): Payer: PRIVATE HEALTH INSURANCE

## 2011-03-30 LAB — COMPREHENSIVE METABOLIC PANEL
ALT: 15 U/L (ref 0–35)
Albumin: 2 g/dL — ABNORMAL LOW (ref 3.5–5.2)
Alkaline Phosphatase: 48 U/L (ref 39–117)
BUN: 5 mg/dL — ABNORMAL LOW (ref 6–23)
Potassium: 2.9 mEq/L — ABNORMAL LOW (ref 3.5–5.1)
Sodium: 137 mEq/L (ref 135–145)
Total Protein: 5.1 g/dL — ABNORMAL LOW (ref 6.0–8.3)

## 2011-03-30 LAB — TSH: TSH: 2.258 u[IU]/mL (ref 0.350–4.500)

## 2011-03-30 LAB — CBC
MCHC: 33.6 g/dL (ref 30.0–36.0)
Platelets: 226 10*3/uL (ref 150–400)
RDW: 13.6 % (ref 11.5–15.5)

## 2011-03-30 LAB — MAGNESIUM: Magnesium: 1.7 mg/dL (ref 1.5–2.5)

## 2011-03-30 LAB — FECAL LACTOFERRIN, QUANT: Fecal Lactoferrin: POSITIVE

## 2011-03-30 MED ORDER — IOHEXOL 300 MG/ML  SOLN
100.0000 mL | Freq: Once | INTRAMUSCULAR | Status: AC | PRN
  Administered 2011-03-30: 100 mL via INTRAVENOUS

## 2011-03-30 MED ORDER — IOHEXOL 300 MG/ML  SOLN: 100.0000 mL | Freq: Once | INTRAMUSCULAR | Status: AC | PRN

## 2011-03-31 LAB — COMPREHENSIVE METABOLIC PANEL
AST: 19 U/L (ref 0–37)
CO2: 23 mEq/L (ref 19–32)
Calcium: 7.7 mg/dL — ABNORMAL LOW (ref 8.4–10.5)
Creatinine, Ser: 0.66 mg/dL (ref 0.50–1.10)
GFR calc Af Amer: >60 mL/min (ref >60)
GFR calc non Af Amer: >60 mL/min (ref >60)

## 2011-03-31 LAB — URINE CULTURE
Colony Count: 10000
Culture  Setup Time: 201209010625
Special Requests: NEGATIVE

## 2011-04-01 LAB — BASIC METABOLIC PANEL
BUN: 4 mg/dL — ABNORMAL LOW (ref 6–23)
Calcium: 7.5 mg/dL — ABNORMAL LOW (ref 8.4–10.5)
Creatinine, Ser: 0.56 mg/dL (ref 0.50–1.10)
GFR calc non Af Amer: >60 mL/min (ref >60)
Glucose, Bld: 99 mg/dL (ref 70–99)

## 2011-04-01 LAB — CBC
MCH: 30.2 pg (ref 26.0–34.0)
MCV: 89.3 fL (ref 78.0–100.0)
Platelets: 280 10*3/uL (ref 150–400)
RDW: 13.5 % (ref 11.5–15.5)

## 2011-04-02 LAB — BASIC METABOLIC PANEL
CO2: 25 mEq/L (ref 19–32)
Chloride: 107 mEq/L (ref 96–112)
Creatinine, Ser: 0.57 mg/dL (ref 0.50–1.10)
GFR calc Af Amer: >60 mL/min (ref >60)
Potassium: 3.3 mEq/L — ABNORMAL LOW (ref 3.5–5.1)

## 2011-04-02 LAB — CBC
MCH: 29.8 pg (ref 26.0–34.0)
MCHC: 33.5 g/dL (ref 30.0–36.0)
MCV: 89.2 fL (ref 78.0–100.0)
Platelets: 341 10*3/uL (ref 150–400)

## 2011-04-03 LAB — CRYPTOSPORIDIUM SMEAR, FECAL

## 2011-04-03 LAB — OVA AND PARASITE EXAMINATION

## 2011-04-05 ENCOUNTER — Encounter: Payer: Self-pay | Admitting: Internal Medicine

## 2011-04-05 ENCOUNTER — Ambulatory Visit (INDEPENDENT_AMBULATORY_CARE_PROVIDER_SITE_OTHER): Payer: PRIVATE HEALTH INSURANCE | Admitting: Internal Medicine

## 2011-04-05 VITALS — BP 100/74 | Temp 98.4°F | Wt 138.0 lb

## 2011-04-05 DIAGNOSIS — A020 Salmonella enteritis: Secondary | ICD-10-CM

## 2011-04-05 LAB — CULTURE, BLOOD (ROUTINE X 2): Culture  Setup Time: 201209010306

## 2011-04-05 LAB — STOOL CULTURE

## 2011-04-05 NOTE — Progress Notes (Signed)
  Subjective:    Patient ID: Kathy Floyd, female    DOB: 18-Aug-1960, 50 y.o.   MRN: [id number]  HPI  50 year old patient who is seen today for followup. She was discharged in the hospital 3 days ago after a direct admission from the office for an acute diarrheal illness with iron depletion and electrolyte imbalance. This occurred the following day orthopedic procedure for lysis of adhesions. She was diagnosed with Salmonella enterocolitis. She is completing Cipro and is much improved she is having 2 or 3 poorly formed stools per day but gaining strength each day she is on potassium supplement do 2 hypokalemia she wishes to return to work on Monday but feels that she will require a somewhat restricted schedule.   Review of Systems  HENT: Negative for hearing loss, congestion, sore throat, rhinorrhea, dental problem, sinus pressure and tinnitus.   Eyes: Negative for pain, discharge and visual disturbance.  Respiratory: Negative for cough and shortness of breath.   Cardiovascular: Negative for chest pain, palpitations and leg swelling.  Gastrointestinal: Positive for abdominal pain. Negative for nausea, vomiting, diarrhea, constipation, blood in stool and abdominal distention.  Genitourinary: Negative for dysuria, urgency, frequency, hematuria, flank pain, vaginal bleeding, vaginal discharge, difficulty urinating, vaginal pain and pelvic pain.  Musculoskeletal: Negative for joint swelling, arthralgias and gait problem.  Skin: Negative for rash.  Neurological: Positive for weakness. Negative for dizziness, syncope, speech difficulty, numbness and headaches.  Hematological: Negative for adenopathy.  Psychiatric/Behavioral: Negative for behavioral problems, dysphoric mood and agitation. The patient is not nervous/anxious.        Objective:   Physical Exam  Constitutional: She is oriented to person, place, and time. She appears well-developed and well-nourished.  HENT:  Head: Normocephalic.    Right Ear: External ear normal.  Left Ear: External ear normal.  Mouth/Throat: Oropharynx is clear and moist.  Eyes: Conjunctivae and EOM are normal. Pupils are equal, round, and reactive to light.  Neck: Normal range of motion. Neck supple. No thyromegaly present.  Cardiovascular: Normal rate, regular rhythm, normal heart sounds and intact distal pulses.   Pulmonary/Chest: Effort normal and breath sounds normal.  Abdominal: Soft. Bowel sounds are normal. She exhibits no mass. There is no tenderness.       Very mild abdominal tenderness  Musculoskeletal: Normal range of motion.  Lymphadenopathy:    She has no cervical adenopathy.  Neurological: She is alert and oriented to person, place, and time.  Skin: Skin is warm and dry. No rash noted.  Psychiatric: She has a normal mood and affect. Her behavior is normal.          Assessment & Plan:   Status post Salmonella enterocolitis with fibrillation and electrolyte abnormalities. Weak but clinically much improved and steadily improving. A letter was written on her behalf to return to work in 3 days on a limited schedule for a full work week. Electrolytes and CBC be checked at her office next week

## 2011-04-05 NOTE — Patient Instructions (Addendum)
Advance diet as tolerated  Take your antibiotic as prescribed until ALL of it is gone, but stop if you develop a rash, swelling, or any side effects of the medication.  Contact our office as soon as possible if  there are side effects of the medication.

## 2011-04-06 NOTE — Discharge Summary (Signed)
Kathy Floyd, Kathy Floyd           ACCOUNT NO.:  [id number]  MEDICAL RECORD NO.:  [id number]  LOCATION:  1421                         FACILITY:  [facility]  PHYSICIAN:  Hartley Barefoot, MD    DATE OF BIRTH:  [birth year]  DATE OF ADMISSION:  03/29/2011 DATE OF DISCHARGE:  04/02/2011                              DISCHARGE SUMMARY   DISCHARGE DIAGNOSES: 1. Colitis.  Nontyphoid Salmonella likely. 2. Hypotension secondary to decreased volume in the setting of profuse     diarrhea and dehydration. 3. Hypokalemia secondary to diarrhea. 4. Hypoxemia secondary to atelectasis, resolved. 5. Anemia of acute illness and post surgery. 6. History of recent lysis of pelvic adhesions, stable.  No acute     complications.  DISCHARGE MEDICATIONS: 1. Ciprofloxacin 500 mg p.o. b.i.d. 2. Ferrous sulfate 325 p.o. b.i.d. 3. Percocet 5/325 every 6 hours as needed. 4. Benadryl 35 mg 1 tablet by mouth daily at bedtime. 5. Calcium 1 tablet 3 times a day.6. Coenzyme 10 one tablet daily. 7. Dicyclomine 30 mg 4 times daily as needed. 8. Fish oil 1000 mg by mouth 3 times weekly. 9. Ibuprofen 200 mg 3 tablets by mouth every 8 hours. 10.Lorazepam 0.5 mg every 8 hours as needed. 11.Multivitamins 1 tablet by mouth daily. 12.Vitamin C 1 tablet by mouth twice weekly. 13.Yasmin 1 tablet by mouth daily. 14.KCl 20 mEq p.o. daily for 5 days.  RADIOGRAPHIC STUDIES: 1. CT abdomen and pelvis on April 01, 2011, showed expected     postoperative changes with free pelvic fluid and a small amount of     free air.  A small bilateral pleural effusion, overlying bibasilar     atelectasis.  Distal and terminal ileum, bowel wall thickening,     similar finding involving the cecum and sigmoid colon.  Findings     suggest an inflammatory or infectious colitis. 2. KUB, March 30, 2011, showed mild prominence      through the colon and the small bowel without distention, likely     due to mild ileus. 3. Chest x-ray, shallow  inspiration with atelectasis or infiltration     in the lung bases.  BRIEF HISTORY OF PRESENT ILLNESS:  This is a very pleasant 50 year old who has been having large volume diarrhea since 1 week prior to admission.  She related diarrhea was consistently getting worse. Multiple bowel movements every hour.  She has become increasingly weak and feeling dizzy.  She had gone for a gynecologic procedure on Wednesday, where she had lysis of pelvic adhesions.  She was discharged home.  Apparently, she had not told Gynecology about her diarrhea.  She was discharged home and then she presented to her primary care physician's office the day of admission with persistent diarrhea.  At her primary care physician, she was noted to be hypotensive, and her blood pressure was in the 70s.  She went to her primary care physician and her blood pressure was in the 70s.  She was then transferred to the emergency department.  She is complaining of abdominal pain.  She denies nausea, vomiting.  No sick contact.  She was in a safari bed a week prior to starting having this diarrhea.  She had C difficile  in the emergency department that was negative.  HOSPITAL COURSE: 1. Colitis salmonellosis.  The patient was admitted to Telemetry.  She     was started on IV fluid aggressive hydration.  She spiked fever,     subsequently she was started on Cipro and Flagyl.  A stool culture     was obtained and it grew salmonella.  The species type is pending     at this time.  This will need to be followed up by her primary care     physician.  Over the course of hospitalization, her diarrhea has     improved.  She is now having only 2 bowel movements per day.  She     has been tolerating oral antibiotics and diet.  The health     department already has been contacted.  Information was provided to     the patient. 2. Hypokalemia.  Her potassium was at 2.9.  This was largely secondary     to diarrhea.  She received multiple  potassium supplement.  She will     be discharged on KCl 20 mEq p.o. daily.  She will need a BMET when     she follows with her primary care physician to make sure that her     potassium level is fine. 3. Hypotension, in the setting of severe dehydration, profuse     diarrhea, hypovolemia.  She had a lactic acid that initially was     normal at 1.6, subsequently mildly increased at 2.6, but over the     course of hospitalization decreased to 0.7.  I  think this was just     reflection of hypotension and decreased perfusion.  She did not     require any pressors.  Her hypotension resolved with IV fluids.     She had a cortisol level that was normal at 2 hours.  She had a TSH     that was normal at 2.5.  Her blood pressure now has remained in the     100 range.  This appeared to be the patient's baseline.  Her Toprol     will be on hold to avoid hypotension.  She will need to be careful     with pain medications. 4. Chronic pelvic pain status post recent lysis of pelvic adhesions.     Dr. Henderson Cloud saw the patient during hospitalization.  We appreciate     her help.  She reviewed the CT results with Radiology and a small     amount of free air was consistent with recent surgery.  No evidence     of bowel perforation or anything like that on CT scan.  She,     however, will need to follow with Dr. Henderson Cloud in 1 week. 5. Hypoxemia.  The patient's oxygen saturations decreased to 88 on     room air and 91 on room air.  She was started on oxygen.  A chest x-     ray showed atelectasis versus infiltrate.  Initially, we were     treating her for pneumonia, but subsequently vancomycin was stopped     because her fever was thought to be secondary to the colitis.  Her     hypoxemia was likely secondary to atelectasis secondary to poor     inspiration due to abdominal pain.  After incentive spirometry,     exercise was started.  The patient oxygen saturation has improved,     now she  is 97% on room  air. 6. Anemia, probably after surgery and acute illness.  She will be     discharged on iron supplements.  Her hemoglobin has remained stable     in the 9 range.  She will need to follow with her primary care     physician and have a hemoglobin level checked.  If anemia persists,     she might need an anemia panel.  On the day of discharge, the patient was in improved condition.  Blood pressure 104/68, saturations 96% on room air, respirations 18, pulse 72, temperature 98.  Hemoglobin 9.4, hematocrit 28, platelets 341, potassium 3.3, chloride 107, bicarb 25, glucose 102, sodium 139, BUN 4, creatinine 0.5.  The patient was discharged in a stable condition.     Hartley Barefoot, MD    BR/MEDQ  D:  04/02/2011  T:  04/02/2011  Job:  [id number]  Electronically Signed by Hartley Barefoot MD on 04/06/2011 01:13:25 PM

## 2011-04-09 NOTE — H&P (Signed)
Kathy Floyd, Kathy Floyd NO.:  [id number]  MEDICAL RECORD NO.:  [id number]  LOCATION:  [facility]                         FACILITY:  [facility]  PHYSICIAN:  Erick Blinks, MD     DATE OF BIRTH:  April 03, 1961  DATE OF ADMISSION:  03/29/2011 DATE OF DISCHARGE:                             HISTORY & PHYSICAL   PRIMARY CARE PHYSICIAN:  Dr. Eleonore Chiquito.  CHIEF COMPLAINT:  Diarrhea.  HISTORY OF PRESENT ILLNESS:  This is a 50 year old female who has been experiencing large volume diarrhea since this past Saturday, which is approximately 1 week.  She reports that the diarrhea has consistently been getting worse.  It is large volume and occurring pretty much multiple times on the hour.  The patient even had to go home early from work on Monday due to the amount of diarrhea.  She is becoming increasingly weak and was feeling dizzy.  She had gone for a gynecologic procedure on Wednesday where she had lysis of pelvic adhesions.  She was discharged home.  Apparently, she had not told her GYN about her diarrhea.  She was discharged home and came to her primary care physician's office today regarding the persistence of her diarrhea.  In her primary care's office, she was noted to be hypotensive with systolic blood pressure in the 70s and she was subsequently referred to the emergency room for further evaluation and treatment.  She denies any fever, nausea, or vomiting.  She does have abdominal pain, but relates this to postoperative.  She denies any dysuria or hematuria.  She reports her stools being initially yellow colored and then subsequently dark green.  She has not had any recent travel.  No sick contacts.  She does not know anybody who has had similar symptoms.  She has not eaten out recently.  In the ER, again she was noted to be hypotensive, which minimally responded to IV fluids.  She continues to have persistent diarrhea.  Of note, Clostridium difficile was checked in the  emergency room, which was found to be negative.  Due to borderline blood pressure and tachycardia, she has been referred for observation overnight for continued IV hydration.  PAST MEDICAL HISTORY: 1. Anxiety. 2. Chronic pelvic pain, status post recent lysis of pelvic adhesions.  ALLERGIES:  SULFA causes rash.  MEDICATIONS PRIOR TO ADMISSION: 1. Toprol for anxiety. 2. Dicyclomine. 3. The remainder of her medications will need to be verified by     pharmacy.  FAMILY HISTORY:  Father has history of arthritis and coronary artery disease.  Paternal grandfather had an MI.  She has 1 brother in good health.  SOCIAL HISTORY:  The patient is a nonsmoker.  She drinks alcohol on occasion.  Denies any illicit drug use.  REVIEW OF SYSTEMS:  All systems reviewed and pertinent positives in the HPI, otherwise negative.  PHYSICAL EXAMINATION:  VITAL SIGNS:  Temperature 98.3, blood pressure initially 87/52 on admission, latest blood pressure is 99/50, heart rate of 90, respirations 19, and pulse oximetry 100% on room air. GENERAL:  The patient is in no acute distress, lying in bed. HEENT:  Normocephalic and atraumatic.  Pupils are equal, round, and reactive to light. NECK:  Supple. CHEST:  Clear to auscultation bilaterally. CARDIAC:  Shows S1 and S2 with tachycardia. ABDOMEN:  Soft.  There are incisions consistent with the patient's recent laparoscopic surgery.  It is diffusely tender.  Bowel sounds are hyperactive. EXTREMITIES:  Show no cyanosis, clubbing, or edema. NEUROLOGICAL:  The patient has equal strength bilaterally.  Cranial nerves II through XII are grossly intact. SKIN:  Warm.  LABORATORY DATA:  WBCs 9.6, hemoglobin 11.7, and platelet count of 262. Urinalysis is negative for nitrites and there are trace leukocytes. Sodium 136, potassium 3.7, chloride 101, bicarbonate 23, glucose 117, BUN 11, and creatinine 0.94.  LFTs are within normal range.  Albumin is low at 2.9 and  calcium of 8.7.  Clostridium difficile PCR is negative.  ASSESSMENT AND PLAN: 1. Hypotension.  This is likely secondary to volume depletion.  Due to     the patient's diarrhea, the patient will be continued on IV fluids.     She will be encourage p.o. intake and we will keep a close watch on     her blood pressure.  We will also check a random cortisol and TSH. 2. Tachycardia, again likely volume related.  The patient does appear     to have chronic tachycardia as well for which she is on Toprol.  We     will restart her Toprol as her blood pressure tolerates and again,     we will be checking a TSH. 3. Diarrhea.  This may be a viral/bacterial gastroenteritis.  Her     Clostridium difficile is already negative.  We will check stool     culture as well as ova and parasites.  We will hold off on     antibiotics at this point since she does not have elevated WBC     count nor fever.  If her stool studies were negative, then she can     likely be given Imodium as this is likely viral gastroenteritis.     The patient will be kept on 23-hour observation and if she     improves, can likely be discharged tomorrow to follow up with her     primary care physician.  Further orders will be per the clinical     course.     Erick Blinks, MD     JM/MEDQ  D:  03/29/2011  T:  03/29/2011  Job:  [id number]  cc:   Gordy Savers, MD [address] [place] [address]  Electronically Signed by Erick Blinks  on 04/09/2011 10:23:52 PM

## 2011-04-16 LAB — STOOL CULTURE

## 2011-05-15 ENCOUNTER — Other Ambulatory Visit: Payer: Self-pay

## 2011-05-15 MED ORDER — LORAZEPAM 0.5 MG PO TABS: 0.5000 mg | ORAL_TABLET | Freq: Two times a day (BID) | ORAL | Status: DC | PRN

## 2011-05-15 NOTE — Telephone Encounter (Signed)
[facility] 

## 2011-09-27 ENCOUNTER — Other Ambulatory Visit: Payer: Self-pay

## 2011-09-27 MED ORDER — LORAZEPAM 0.5 MG PO TABS: 0.5000 mg | ORAL_TABLET | Freq: Two times a day (BID) | ORAL | Status: DC | PRN

## 2011-09-30 ENCOUNTER — Telehealth: Payer: Self-pay | Admitting: Internal Medicine

## 2011-09-30 MED ORDER — DICYCLOMINE HCL 20 MG PO TABS: 20.0000 mg | ORAL_TABLET | ORAL | Status: DC | PRN

## 2011-09-30 NOTE — Telephone Encounter (Signed)
Patient called stating that she need a refill on her docyclomine. Please assist.

## 2011-09-30 NOTE — Telephone Encounter (Signed)
Done

## 2012-02-10 ENCOUNTER — Other Ambulatory Visit: Payer: Self-pay

## 2012-02-10 MED ORDER — LORAZEPAM 0.5 MG PO TABS: 0.5000 mg | ORAL_TABLET | Freq: Two times a day (BID) | ORAL | Status: DC | PRN

## 2012-07-13 ENCOUNTER — Other Ambulatory Visit: Payer: Self-pay | Admitting: Internal Medicine

## 2012-07-15 ENCOUNTER — Other Ambulatory Visit: Payer: Self-pay | Admitting: Internal Medicine

## 2012-07-15 MED ORDER — LORAZEPAM 0.5 MG PO TABS: 0.5000 mg | ORAL_TABLET | Freq: Two times a day (BID) | ORAL | Status: DC | PRN

## 2012-07-15 NOTE — Telephone Encounter (Signed)
Spoke to pt told her called refill into pharmacy no more refills till appt. Pt verbalized understanding.

## 2012-07-15 NOTE — Telephone Encounter (Signed)
Pt has made appt for follow up on meds on 07/31/12. Could you fill her LORazepam  .05mg   (pt said even 1/2 script ok, just to get her until her appt) [facility] [address]

## 2012-07-29 HISTORY — PX: LAPAROSCOPIC ENDOMETRIOSIS FULGURATION: SUR769

## 2012-07-31 ENCOUNTER — Ambulatory Visit (INDEPENDENT_AMBULATORY_CARE_PROVIDER_SITE_OTHER): Payer: PRIVATE HEALTH INSURANCE | Admitting: Internal Medicine

## 2012-07-31 ENCOUNTER — Encounter: Payer: Self-pay | Admitting: Internal Medicine

## 2012-07-31 VITALS — BP 100/72 | HR 83 | Temp 98.3°F | Resp 18 | Wt 154.0 lb

## 2012-07-31 DIAGNOSIS — F411 Generalized anxiety disorder: Secondary | ICD-10-CM

## 2012-07-31 MED ORDER — METOPROLOL SUCCINATE ER 100 MG PO TB24: 100.0000 mg | ORAL_TABLET | Freq: Every day | ORAL | Status: DC

## 2012-07-31 MED ORDER — LORAZEPAM 0.5 MG PO TABS: 0.5000 mg | ORAL_TABLET | Freq: Two times a day (BID) | ORAL | Status: DC | PRN

## 2012-07-31 NOTE — Patient Instructions (Signed)
It  is important that you exercise regularly, at least 20 minutes 3 to 4 times per week.  If you develop chest pain or shortness of breath seek  medical attention.  Schedule your colonoscopy to help detect colon cancer. 

## 2012-07-31 NOTE — Progress Notes (Signed)
  Subjective:    Patient ID: Kathy Floyd, female    DOB: Aug 10, 1960, 52 y.o.   MRN: [id number]  HPI  52 year old patient who is seen today for followup. She is followed closely by gynecology. She has not been seen here in over one year and does have a history of a mild anxiety disorder. She has been on metoprolol for some time which he feels is quite helpful as far as stress and anxiety. She also uses lorazepam on an as-needed basis. In general doing quite well without concerns or complaints. This basically is seen here today for general followup and medication refill.    Review of Systems  Constitutional: Negative.   HENT: Negative for hearing loss, congestion, sore throat, rhinorrhea, dental problem, sinus pressure and tinnitus.   Eyes: Negative for pain, discharge and visual disturbance.  Respiratory: Negative for cough and shortness of breath.   Cardiovascular: Negative for chest pain, palpitations and leg swelling.  Gastrointestinal: Negative for nausea, vomiting, abdominal pain, diarrhea, constipation, blood in stool and abdominal distention.  Genitourinary: Negative for dysuria, urgency, frequency, hematuria, flank pain, vaginal bleeding, vaginal discharge, difficulty urinating, vaginal pain and pelvic pain.  Musculoskeletal: Negative for joint swelling, arthralgias and gait problem.  Skin: Negative for rash.  Neurological: Negative for dizziness, syncope, speech difficulty, weakness, numbness and headaches.  Hematological: Negative for adenopathy.  Psychiatric/Behavioral: Negative for behavioral problems, dysphoric mood and agitation. The patient is nervous/anxious.        Objective:   Physical Exam  Constitutional: She is oriented to person, place, and time. She appears well-developed and well-nourished.  HENT:  Head: Normocephalic.  Right Ear: External ear normal.  Left Ear: External ear normal.  Mouth/Throat: Oropharynx is clear and moist.  Eyes: Conjunctivae normal  and EOM are normal. Pupils are equal, round, and reactive to light.  Neck: Normal range of motion. Neck supple. No thyromegaly present.  Cardiovascular: Normal rate, regular rhythm, normal heart sounds and intact distal pulses.   Pulmonary/Chest: Effort normal and breath sounds normal.  Abdominal: Soft. Bowel sounds are normal. She exhibits no mass. There is no tenderness.  Musculoskeletal: Normal range of motion.  Lymphadenopathy:    She has no cervical adenopathy.  Neurological: She is alert and oriented to person, place, and time.  Skin: Skin is warm and dry. No rash noted.  Psychiatric: She has a normal mood and affect. Her behavior is normal.          Assessment & Plan:   Anxiety disorder. Will refill her present medications. History of endometriosis. Followup GYN Health maintenance. We'll encourage a screening colonoscopy

## 2012-11-19 ENCOUNTER — Other Ambulatory Visit: Payer: Self-pay | Admitting: Internal Medicine

## 2012-11-20 NOTE — Telephone Encounter (Signed)
Pt states she never filled the script from Jan. For LORazepam (ATIVAN) 0.5 MG tablet.

## 2013-01-30 IMAGING — CT CT ABD-PELV W/ CM
2 of 5 series · 16 of 46 positions shown, 18 images · IV contrast (APPLIED)
Comparison: MRI abdomen 08/21/2010.

CLINICAL DATA: Abdominal pain and diarrhea.  History of recent
surgery for lysis of pelvic adhesions due to endometriosis.

CT ABDOMEN AND PELVIS WITH CONTRAST
TECHNIQUE: Multidetector CT imaging of the abdomen and pelvis was
performed following the standard protocol during bolus
administration of intravenous contrast.
Contrast: 100 ml [id number]

[Series 2: abd_pel 5.0 b40f st · axial · 0.71mm/px · z∈[-484,-50]mm · 13 of 99 slices shown, 15 images]
[im 6/99  soft-tissue]
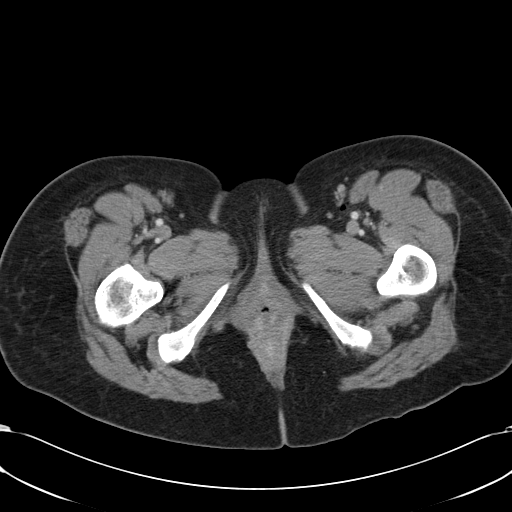
[im 6/99  bone]
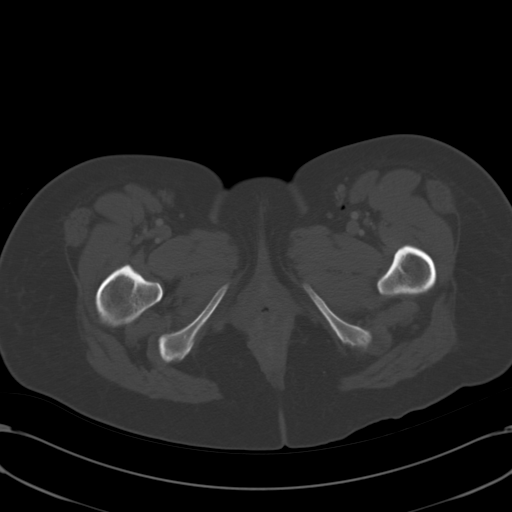
[im 11/99  soft-tissue]
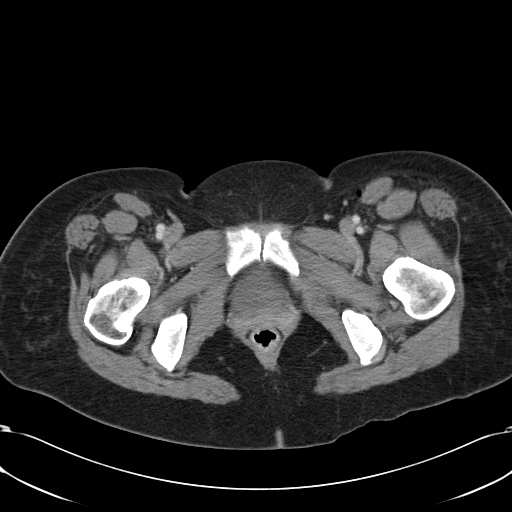
[im 22/99  soft-tissue]
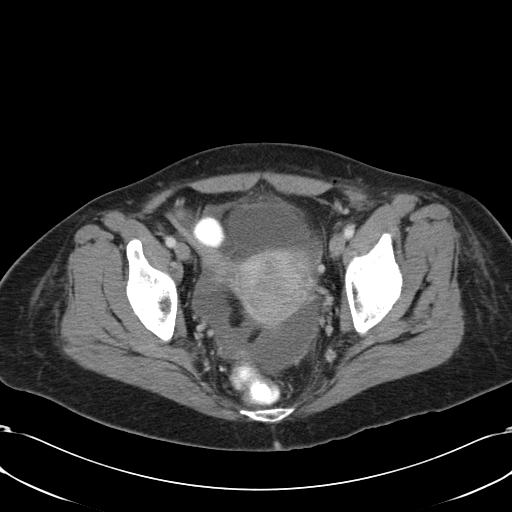
[im 28/99  soft-tissue]
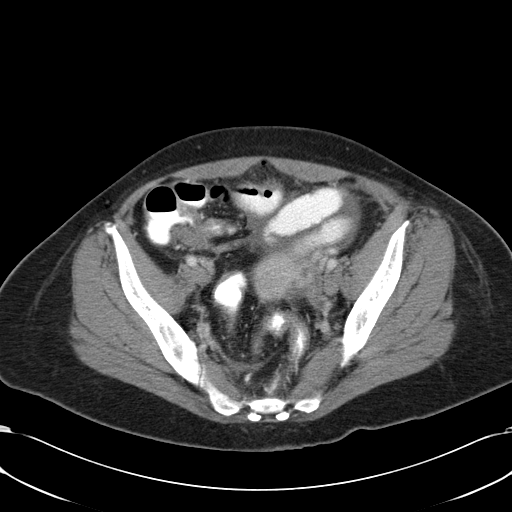
[im 33/99  soft-tissue]
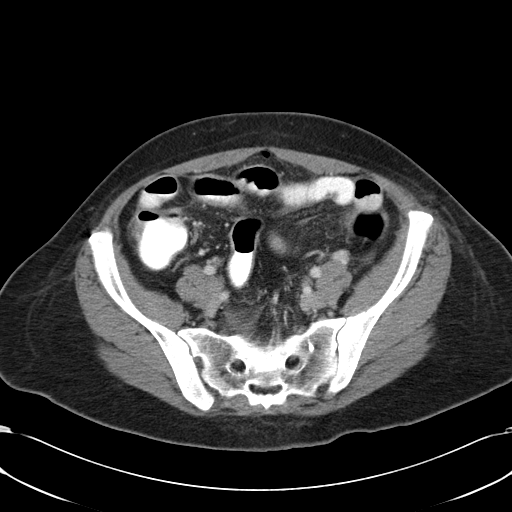
[im 44/99  soft-tissue]
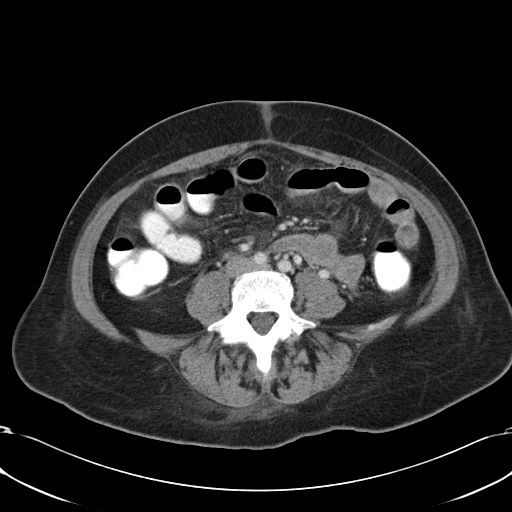
[im 50/99  soft-tissue]
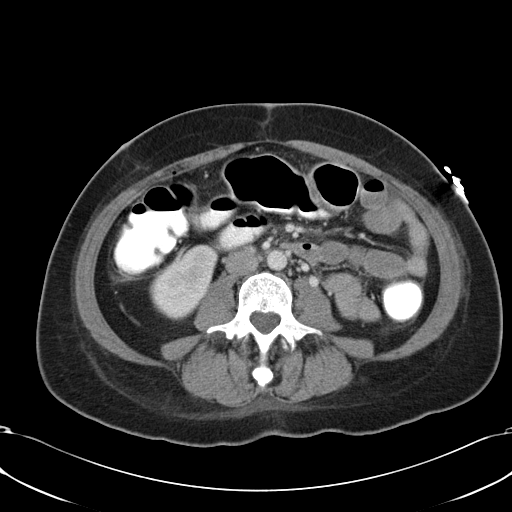
[im 55/99  soft-tissue]
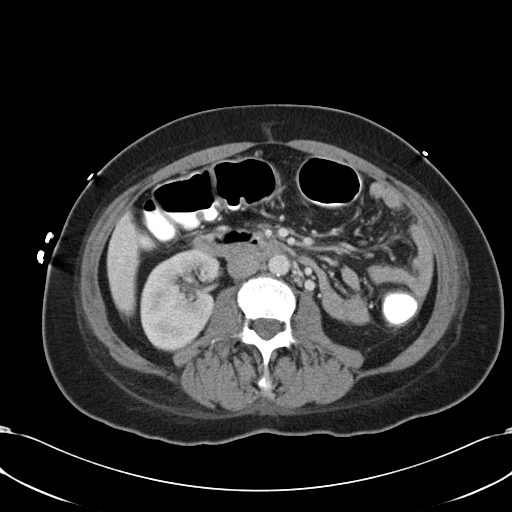
[im 66/99  soft-tissue]
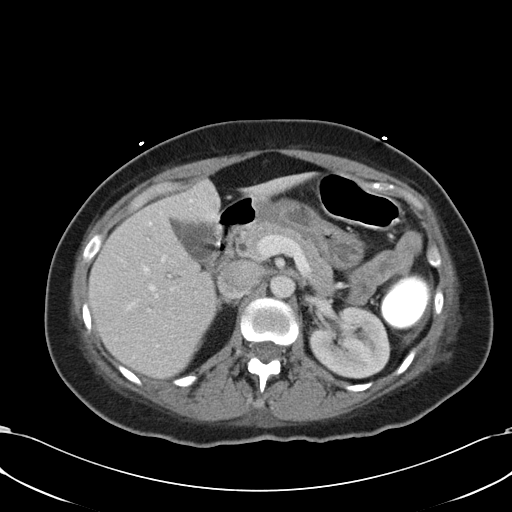
[im 66/99  bone]
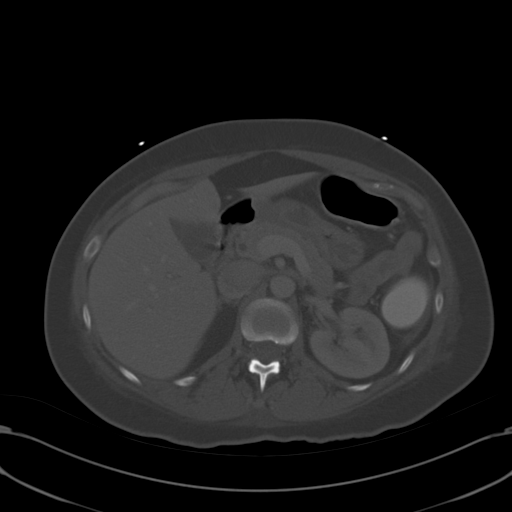
[im 71/99  soft-tissue]
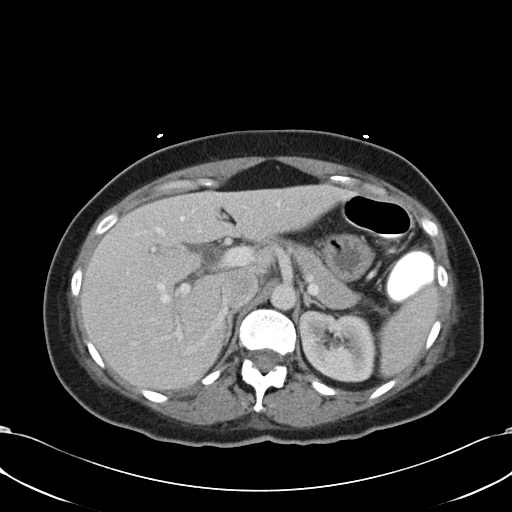
[im 77/99  soft-tissue]
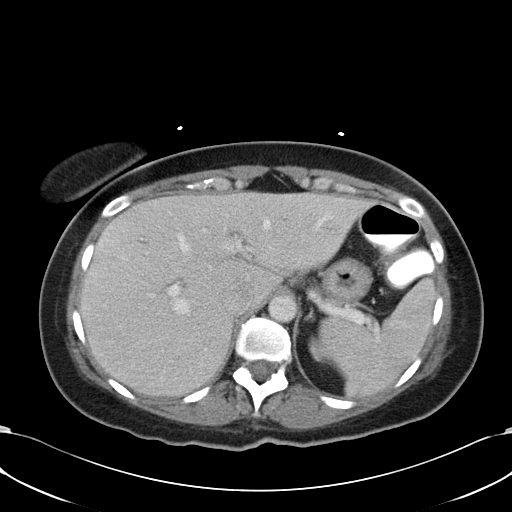
[im 88/99  soft-tissue]
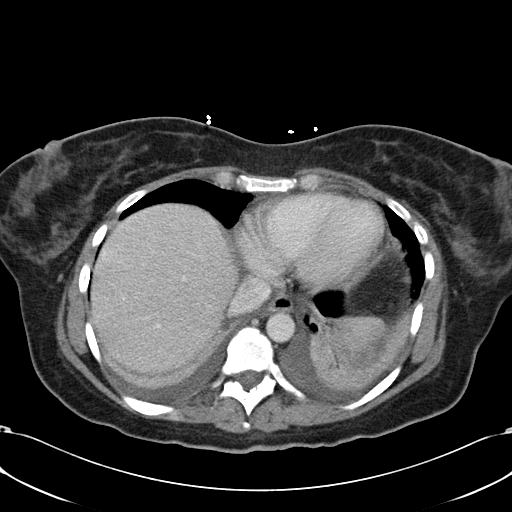
[im 93/99  soft-tissue]
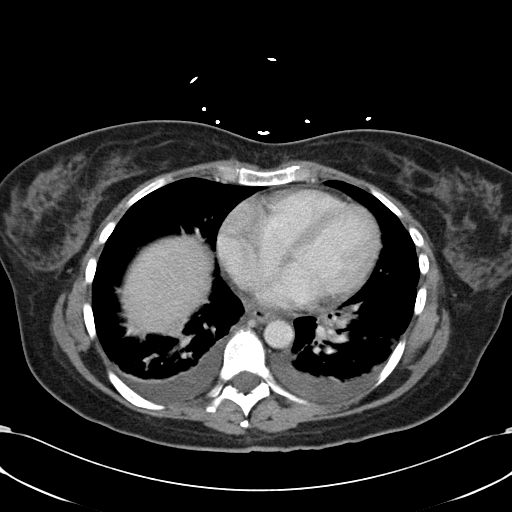

[Series 602: coronal · coronal · 1.00mm/px · 3 of 106 slices shown]
[im 36/106  soft-tissue]
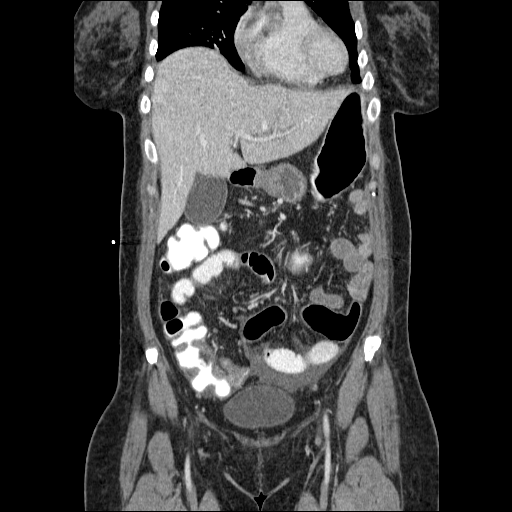
[im 47/106  soft-tissue]
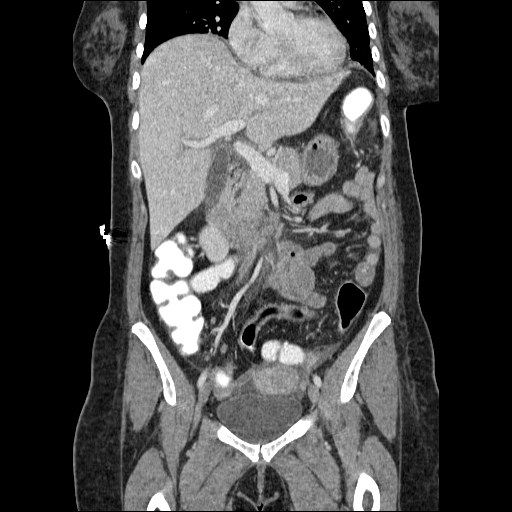
[im 59/106  soft-tissue]
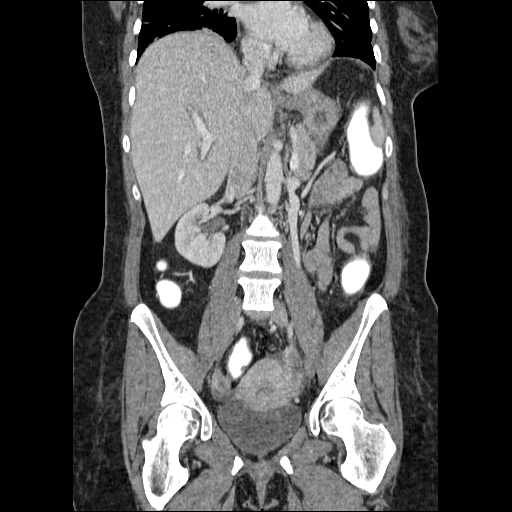

[16 of 46 positions shown; findings below may reference images not displayed]

FINDINGS: The lung bases demonstrates small bilateral pleural
effusions and overlying bibasilar atelectasis.

The liver is unremarkable.  No focal lesions or biliary dilatation.
The gallbladder is mildly distended.  No pericholecystic
inflammatory change or gallbladder wall thickening.  The common
bile duct is normal in caliber.  The pancreas is normal.  The
spleen is normal in size.  No focal lesions.  The adrenal glands
and kidneys are unremarkable.  A small right renal cyst is noted.

The stomach is not well distended.  No gross abnormalities are
seen.  The duodenum is normal. There is moderate wall thickening
involving the distal and terminal ileum along with similar findings
involving the cecum.  There is a mild wall thickening involving the
sigmoid colon also noted.  Findings suggest an inflammatory or
infectious enterocolitis.

Moderate free pelvic fluid likely due to recent surgery.  A small
amount of free air is also likely postsurgical.

The uterus demonstrates a small fibroids.  The ovaries are grossly
normal.  Small scattered mesenteric and retroperitoneal lymph nodes
are noted.  The aorta is normal in caliber.  The major branch
vessels are normal.
IMPRESSION: 1.  Expected postoperative changes with free pelvic fluid and a
small amount of free air.
2.  Small bilateral pleural effusions and overlying bibasilar
atelectasis.
3.  Distal and terminal ileal bowel wall thickening and similar
findings involving the cecum and sigmoid colon.  Findings suggest
an inflammatory or infectious colitis.

## 2013-03-09 ENCOUNTER — Other Ambulatory Visit: Payer: Self-pay | Admitting: Obstetrics and Gynecology

## 2013-03-12 ENCOUNTER — Other Ambulatory Visit: Payer: Self-pay | Admitting: Internal Medicine

## 2013-03-12 ENCOUNTER — Other Ambulatory Visit: Payer: Self-pay | Admitting: Obstetrics and Gynecology

## 2013-03-12 DIAGNOSIS — R928 Other abnormal and inconclusive findings on diagnostic imaging of breast: Secondary | ICD-10-CM

## 2013-04-01 ENCOUNTER — Ambulatory Visit
Admission: RE | Admit: 2013-04-01 | Discharge: 2013-04-01 | Disposition: A | Discharge status: Home / Self Care | Payer: PRIVATE HEALTH INSURANCE | Source: Ambulatory Visit | Attending: Obstetrics and Gynecology | Admitting: Obstetrics and Gynecology

## 2013-04-01 DIAGNOSIS — R928 Other abnormal and inconclusive findings on diagnostic imaging of breast: Secondary | ICD-10-CM

## 2013-06-09 ENCOUNTER — Other Ambulatory Visit: Payer: Self-pay | Admitting: Internal Medicine

## 2013-08-06 LAB — HM COLONOSCOPY

## 2013-08-16 ENCOUNTER — Other Ambulatory Visit: Payer: Self-pay | Admitting: Internal Medicine

## 2013-08-18 ENCOUNTER — Other Ambulatory Visit: Payer: Self-pay | Admitting: Internal Medicine

## 2013-08-19 NOTE — Telephone Encounter (Signed)
Pt states her rx metoprolol succinate (TOPROL-XL) 100 MG 24 hr tablet Is not at the pharm. Looks like it was done 1/19. Can you resend?  [facility]  [facility]t for [facility]b 18. Her insurance will only pay for a 30 day rx/ can you do w/ 2 refills?

## 2013-08-20 ENCOUNTER — Other Ambulatory Visit: Payer: Self-pay | Admitting: Internal Medicine

## 2013-08-20 NOTE — Telephone Encounter (Signed)
Pt call  rx into pharm [phone] they did not received e-scribe. Pt is out

## 2013-09-15 ENCOUNTER — Ambulatory Visit: Payer: PRIVATE HEALTH INSURANCE | Admitting: Internal Medicine

## 2013-10-26 ENCOUNTER — Ambulatory Visit (INDEPENDENT_AMBULATORY_CARE_PROVIDER_SITE_OTHER): Payer: PRIVATE HEALTH INSURANCE | Admitting: Internal Medicine

## 2013-10-26 ENCOUNTER — Encounter: Payer: Self-pay | Admitting: Internal Medicine

## 2013-10-26 VITALS — BP 108/70 | HR 68 | Temp 98.5°F | Resp 18 | Ht 63.0 in | Wt 150.0 lb

## 2013-10-26 DIAGNOSIS — F411 Generalized anxiety disorder: Secondary | ICD-10-CM

## 2013-10-26 MED ORDER — LORAZEPAM 0.5 MG PO TABS: ORAL_TABLET | ORAL | Status: DC

## 2013-10-26 MED ORDER — DICYCLOMINE HCL 20 MG PO TABS: 20.0000 mg | ORAL_TABLET | ORAL | Status: DC | PRN

## 2013-10-26 MED ORDER — METOPROLOL SUCCINATE ER 100 MG PO TB24: ORAL_TABLET | ORAL | Status: DC

## 2013-10-26 NOTE — Patient Instructions (Signed)
It is important that you exercise regularly, at least 20 minutes 3 to 4 times per week.  If you develop chest pain or shortness of breath seek  medical attention.  Call or return to clinic prn if these symptoms worsen or fail to improve as anticipated.  Return in one year for follow-up

## 2013-10-26 NOTE — Progress Notes (Signed)
Pre-visit discussion using our clinic review tool. No additional management support is needed unless otherwise documented below in the visit note.  

## 2013-10-26 NOTE — Progress Notes (Signed)
Subjective:    Patient ID: Kathy Floyd, female    DOB: 12/26/1960, 53 y.o.   MRN: [id number]  HPI  53 year old patient who is seen today for her annual followup.  She has a history of anxiety disorder and does take her metoprolol for its anti-anxiety effects.  She also takes occasional anxiolytics.  She is doing quite well without concerns or complaints.  Past Medical History  Diagnosis Date  . Anxiety   . Abdominal cramping     stress related per ppt    History   Social History  . Marital Status: Married    Spouse Name: N/A    Number of Children: N/A  . Years of Education: N/A   Occupational History  . Not on file.   Social History Main Topics  . Smoking status: Never Smoker   . Smokeless tobacco: Never Used  . Alcohol Use: Yes     Comment: occasionally  . Drug Use: No  . Sexual Activity: Not on file   Other Topics Concern  . Not on file   Social History Narrative  . No narrative on file    Past Surgical History  Procedure Laterality Date  . Nasal sinus surgery      x2    No family history on file.  Allergies  Allergen Reactions  . Sulfacetamide Sodium Rash    Current Outpatient Prescriptions on File Prior to Visit  Medication Sig Dispense Refill  . Ascorbic Acid (VITAMIN C PO) Take 1 tablet by mouth 3 (three) times a week.        . calcium carbonate (CALCIUM 500) 1250 MG tablet Take 1 tablet by mouth once a week.        . Coenzyme Q10 (CO Q 10 PO) Take 1 capsule by mouth 3 (three) times a week.        . diphenhydrAMINE (BENADRYL) 25 MG tablet Take 25 mg by mouth at bedtime as needed. sleep       . Drospirenone-Ethinyl Estradiol (YASMIN 28 PO) Take 1 tablet by mouth daily.        . fish oil-omega-3 fatty acids 1000 MG capsule Take 1 g by mouth once a week.        Marland Kitchen LYSINE PO Take 1 tablet by mouth as needed.       . Multiple Vitamin (MULTIVITAMIN) tablet Take 1 tablet by mouth daily.         No current facility-administered medications on file  prior to visit.    BP 108/70  Pulse 68  Temp(Src) 98.5 F (36.9 C) (Oral)  Resp 18  Ht 5\' 3"  (1.6 m)  Wt 150 lb (68.04 kg)  BMI 26.58 kg/m2  SpO2 98%       Review of Systems  Constitutional: Negative.   HENT: Negative for congestion, dental problem, hearing loss, rhinorrhea, sinus pressure, sore throat and tinnitus.   Eyes: Negative for pain, discharge and visual disturbance.  Respiratory: Negative for cough and shortness of breath.   Cardiovascular: Negative for chest pain, palpitations and leg swelling.  Gastrointestinal: Negative for nausea, vomiting, abdominal pain, diarrhea, constipation, blood in stool and abdominal distention.  Genitourinary: Negative for dysuria, urgency, frequency, hematuria, flank pain, vaginal bleeding, vaginal discharge, difficulty urinating, vaginal pain and pelvic pain.  Musculoskeletal: Negative for arthralgias, gait problem and joint swelling.  Skin: Negative for rash.  Neurological: Negative for dizziness, syncope, speech difficulty, weakness, numbness and headaches.  Hematological: Negative for adenopathy.  Psychiatric/Behavioral: Negative for behavioral  problems, dysphoric mood and agitation. The patient is nervous/anxious.        Objective:   Physical Exam  Constitutional: She is oriented to person, place, and time. She appears well-developed and well-nourished.  HENT:  Head: Normocephalic.  Right Ear: External ear normal.  Left Ear: External ear normal.  Mouth/Throat: Oropharynx is clear and moist.  Eyes: Conjunctivae and EOM are normal. Pupils are equal, round, and reactive to light.  Neck: Normal range of motion. Neck supple. No thyromegaly present.  Cardiovascular: Normal rate, regular rhythm, normal heart sounds and intact distal pulses.   Pulmonary/Chest: Effort normal and breath sounds normal.  Abdominal: Soft. Bowel sounds are normal. She exhibits no mass. There is no tenderness.  Musculoskeletal: Normal range of motion.    Lymphadenopathy:    She has no cervical adenopathy.  Neurological: She is alert and oriented to person, place, and time.  Skin: Skin is warm and dry. No rash noted.  Psychiatric: She has a normal mood and affect. Her behavior is normal.          Assessment & Plan:   Anxiety disorder.  Stable we'll continue present regimen.  Medications updated.  CPX in 6-12 months

## 2013-11-04 ENCOUNTER — Telehealth: Payer: Self-pay | Admitting: Internal Medicine

## 2013-11-04 NOTE — Telephone Encounter (Signed)
Patient Information:  Caller Name: Gray  Phone: [phone]  Patient: Kathy Floyd, Kathy Floyd  Gender: Female  DOB: 06-08-1961  Age: 53 Years  PCP: Bluford Kaufmann (Family Practice > 95yrs old)  Pregnant: No  Office Follow Up:  Does the office need to follow up with this patient?: Yes  Instructions For The Office: Requesting RX for weight loss or is there another option?  Pt forgot to ask at her 10/26/13 appt.   Symptoms  Reason For Call & Symptoms: She saw Dr. Margo Common 10/26/13 but for got to ask about weight loss pill/options. She wants to lose about 25 lbs and was using Alli, but it has not come back on the market.  Is there a RX he can write?  Please call and let her know if something was called in.  Reviewed Health History In EMR: Yes  Reviewed Medications In EMR: Yes  Reviewed Allergies In EMR: Yes  Reviewed Surgeries / Procedures: N/A  Date of Onset of Symptoms: Unknown OB / GYN:  LMP: Unknown  Guideline(s) Used:  No Protocol Available - Information Only  Disposition Per Guideline:   Discuss with PCP and Callback by Nurse Today  Reason For Disposition Reached:   Nursing judgment  Advice Given:  N/A  Patient Refused Recommendation:  Patient Requests Prescription  Requesting RX for weight loss or is there any other option?

## 2013-11-04 NOTE — Telephone Encounter (Signed)
Spoke to pt told her Dr. Raliegh Ip is out of the office till Monday will ask him then and get back to her. Pt verbalized understanding.

## 2013-11-10 ENCOUNTER — Telehealth: Payer: Self-pay | Admitting: Internal Medicine

## 2013-11-10 NOTE — Telephone Encounter (Signed)
Error/gd °

## 2013-11-10 NOTE — Telephone Encounter (Signed)
Pt calling back to see if nurse had a chance to speak with Dr. Raliegh Ip yet.

## 2013-11-11 NOTE — Telephone Encounter (Signed)
Spoke to pt, apologized not getting back to her. Told pt weight loss drugs are not indicated at your present BMI weight 150 pounds, would simply encourage more exercise and proportion control with diet per Dr. Raliegh Ip. Pt verbalized understanding.

## 2013-11-11 NOTE — Telephone Encounter (Signed)
Please advise 

## 2013-11-11 NOTE — Telephone Encounter (Signed)
Please call patient and explain that weight loss drugs are not indicated at her present BMI (weight 150 pounds); would simply encourage more exercise, and moderation of her diet

## 2014-03-25 ENCOUNTER — Other Ambulatory Visit: Payer: Self-pay | Admitting: Obstetrics and Gynecology

## 2014-03-29 LAB — CYTOLOGY - PAP

## 2014-03-30 ENCOUNTER — Other Ambulatory Visit: Payer: Self-pay | Admitting: Obstetrics and Gynecology

## 2014-03-30 DIAGNOSIS — R928 Other abnormal and inconclusive findings on diagnostic imaging of breast: Secondary | ICD-10-CM

## 2014-04-06 ENCOUNTER — Other Ambulatory Visit: Payer: PRIVATE HEALTH INSURANCE

## 2014-04-18 ENCOUNTER — Other Ambulatory Visit: Payer: PRIVATE HEALTH INSURANCE

## 2014-06-18 ENCOUNTER — Encounter (HOSPITAL_COMMUNITY): Payer: Self-pay

## 2014-06-18 ENCOUNTER — Emergency Department (HOSPITAL_COMMUNITY)
Admission: EM | Admit: 2014-06-18 | Discharge: 2014-06-18 | Disposition: A | Discharge status: Home / Self Care | Payer: Commercial Managed Care - PPO | Source: Home / Self Care | Attending: Emergency Medicine | Admitting: Emergency Medicine

## 2014-06-18 DIAGNOSIS — W4904XA Ring or other jewelry causing external constriction, initial encounter: Secondary | ICD-10-CM

## 2014-06-18 DIAGNOSIS — L089 Local infection of the skin and subcutaneous tissue, unspecified: Secondary | ICD-10-CM

## 2014-06-18 DIAGNOSIS — Q798 Other congenital malformations of musculoskeletal system: Secondary | ICD-10-CM

## 2014-06-18 MED ORDER — CEPHALEXIN 500 MG PO CAPS: 500.0000 mg | ORAL_CAPSULE | Freq: Three times a day (TID) | ORAL | Status: DC

## 2014-06-18 MED ORDER — MUPIROCIN 2 % EX OINT: 1.0000 "application " | TOPICAL_OINTMENT | Freq: Three times a day (TID) | CUTANEOUS | Status: DC

## 2014-06-18 NOTE — ED Provider Notes (Signed)
Chief Complaint   Finger Injury   History of Present Illness   Kathy Floyd is a 53 year old female who had some irritation under her rings on her left ring finger for the past 2 and half days. She attributes this to frequent handwashing. There is swelling over the PIP joint. She is unable to remove the rings. This is somewhat painful. The tip of the finger is warm and pink and has good capillary refill. She is able to move all of her joints well.  Review of Systems   Other than as noted above, the patient denies any of the following symptoms: Systemic:  No fevers or chills. Musculoskeletal:  No joint pain or arthritis.  Neurological:  No muscular weakness or paresthesias.  Kathy Floyd   Past medical history, family history, social history, meds, and allergies were reviewed.     Physical Examination   Vital signs:  BP 115/71 mmHg  Pulse 76  Temp(Src) 99.2 F (37.3 C) (Oral)  Resp 12  SpO2 97% Gen:  Alert and in no distress. Musculoskeletal:  Exam of the hand reveals she has 3 rings on her left ring finger that are Cylert together, so slight one large, long ring, encompassing the entire proximal phalanx. Underneath the rings the skin is irritated and macerated. The PIP joint is swollen and red.  Otherwise, all joints had a full a ROM with no swelling, bruising or deformity.  No edema, pulses full. Extremities were warm and pink.  Capillary refill was brisk.  Skin:  Clear, warm and dry.  No rash. Neuro:  Alert and oriented.  Muscle strength was normal.  Sensation was intact to light touch.   Procedure Note:  Verbal informed consent was obtained from the patient.  Risks and benefits were outlined with the patient.  Patient understands and accepts these risks. A time out was called and the name of the procedure, the procedure site, and identity of the patient were confirmed verbally and by wristband.    The procedure was then performed as follows:  The ring was split with a ring  cutter in 2 different places, so that could be bent back. It was bent back with 2 needle drivers. There ring was then removed. After ring removal, the skin underneath was erythematous and macerated. There was some swelling over the proximal phalanx and the PIP joint. She is able to flex and extend the joints fully, there is no swelling of the remainder of the finger.  The patient tolerated the procedure well without any immediate complications.     Assessment   The encounter diagnosis was Constriction ring of digit.  She has some skin infection underlying the ring, but there is no evidence of tendon infection.  Plan  1.  Meds:  The following meds were prescribed:   Discharge Medication List as of 06/18/2014  9:55 AM    START taking these medications   Details  cephALEXin (KEFLEX) 500 MG capsule Take 1 capsule (500 mg total) by mouth 3 (three) times daily., Starting 06/18/2014, Until Discontinued, Normal    mupirocin ointment (BACTROBAN) 2 % Apply 1 application topically 3 (three) times daily., Starting 06/18/2014, Until Discontinued, Normal        2.  Patient Education/Counseling:  The patient was given appropriate handouts, self care instructions, and instructed in symptomatic relief, including rest and activity, and elevation. The patient was told to soak the finger briefly in water with Epsom salts, applying a bike ointment, nonstick dressing. She was cautioned to  watch for any signs of developing septic tendinitis and return immediately if that were the case.  3.  Follow up:  The patient was told to follow up here if no better in 3 to 4 days, or sooner if becoming worse in any way, and given some red flag symptoms such as worsening pain, fever, swelling, or neurological symptoms which would prompt immediate return.        Kathy Mo, MD 06/18/14 1048

## 2014-06-18 NOTE — Discharge Instructions (Signed)
Soak in warm [facility] water 3 times daily, apply antibiotic ointment, Telfa dressing, and Coban wrap.  Watch for signs of infection.

## 2014-06-18 NOTE — ED Notes (Signed)
Wearing 3 ring set, soldered together, and reportedly sustained injury under rings set 3 days ago, and had not been able to remove them. States she went to jeweler, and they were unable to remove said set. States her finger is not as swollen as it was

## 2014-08-12 ENCOUNTER — Ambulatory Visit
Admission: RE | Admit: 2014-08-12 | Discharge: 2014-08-12 | Disposition: A | Discharge status: Home / Self Care | Payer: Commercial Managed Care - PPO | Source: Ambulatory Visit | Attending: Obstetrics and Gynecology | Admitting: Obstetrics and Gynecology

## 2014-08-12 DIAGNOSIS — R928 Other abnormal and inconclusive findings on diagnostic imaging of breast: Secondary | ICD-10-CM

## 2014-10-31 ENCOUNTER — Ambulatory Visit: Payer: Commercial Managed Care - PPO | Admitting: *Deleted

## 2015-01-23 ENCOUNTER — Ambulatory Visit (INDEPENDENT_AMBULATORY_CARE_PROVIDER_SITE_OTHER): Payer: Commercial Managed Care - PPO | Admitting: Internal Medicine

## 2015-01-23 ENCOUNTER — Encounter: Payer: Self-pay | Admitting: Internal Medicine

## 2015-01-23 VITALS — BP 100/70 | HR 58 | Temp 98.0°F | Resp 20 | Ht 63.0 in | Wt 156.0 lb

## 2015-01-23 DIAGNOSIS — F411 Generalized anxiety disorder: Secondary | ICD-10-CM | POA: Diagnosis not present

## 2015-01-23 MED ORDER — DICYCLOMINE HCL 20 MG PO TABS: 20.0000 mg | ORAL_TABLET | ORAL | Status: DC | PRN

## 2015-01-23 MED ORDER — METOPROLOL SUCCINATE ER 100 MG PO TB24: ORAL_TABLET | ORAL | Status: DC

## 2015-01-23 MED ORDER — LORAZEPAM 0.5 MG PO TABS: ORAL_TABLET | ORAL | Status: DC

## 2015-01-23 NOTE — Progress Notes (Signed)
Pre visit review using our clinic review tool, if applicable. No additional management support is needed unless otherwise documented below in the visit note. 

## 2015-01-23 NOTE — Patient Instructions (Signed)
It is important that you exercise regularly, at least 20 minutes 3 to 4 times per week.  If you develop chest pain or shortness of breath seek  medical attention.  Return in one year for an annual physical

## 2015-01-23 NOTE — Progress Notes (Signed)
Subjective:    Patient ID: Kathy Floyd, female    DOB: November 19, 1960, 54 y.o.   MRN: [id number]  HPI 54 year old patient who is seen today for follow-up.  She has a history of chronic anxiety and does use metoprolol daily.  She also uses lorazepam 2 or 3 times per week.  She is scheduled for a lab draw at work in the near future.  She is seen by gynecology annually.  No other concerns or complaints.  In general doing quite well.  Past Medical History  Diagnosis Date  . Anxiety   . Abdominal cramping     stress related per ppt    History   Social History  . Marital Status: Married    Spouse Name: N/A  . Number of Children: N/A  . Years of Education: N/A   Occupational History  . Not on file.   Social History Main Topics  . Smoking status: Never Smoker   . Smokeless tobacco: Never Used  . Alcohol Use: Yes     Comment: occasionally  . Drug Use: No  . Sexual Activity: Not on file   Other Topics Concern  . Not on file   Social History Narrative    Past Surgical History  Procedure Laterality Date  . Nasal sinus surgery      x2    No family history on file.  Allergies  Allergen Reactions  . Sulfacetamide Sodium Rash    Current Outpatient Prescriptions on File Prior to Visit  Medication Sig Dispense Refill  . Ascorbic Acid (VITAMIN C PO) Take 1 tablet by mouth 3 (three) times a week.      . calcium carbonate (CALCIUM 500) 1250 MG tablet Take 1 tablet by mouth once a week.      . Coenzyme Q10 (CO Q 10 PO) Take 1 capsule by mouth 3 (three) times a week.      . diphenhydrAMINE (BENADRYL) 25 MG tablet Take 25 mg by mouth at bedtime as needed. sleep     . Drospirenone-Ethinyl Estradiol (YASMIN 28 PO) Take 1 tablet by mouth daily.      . fish oil-omega-3 fatty acids 1000 MG capsule Take 1 g by mouth once a week.      Marland Kitchen LYSINE PO Take 1 tablet by mouth as needed.     . Multiple Vitamin (MULTIVITAMIN) tablet Take 1 tablet by mouth daily.       No current  facility-administered medications on file prior to visit.    BP 100/70 mmHg  Pulse 58  Temp(Src) 98 F (36.7 C) (Oral)  Resp 20  Ht 5\' 3"  (1.6 m)  Wt 156 lb (70.761 kg)  BMI 27.64 kg/m2  SpO2 98%      Review of Systems  Constitutional: Negative.   HENT: Negative for congestion, dental problem, hearing loss, rhinorrhea, sinus pressure, sore throat and tinnitus.   Eyes: Negative for pain, discharge and visual disturbance.  Respiratory: Negative for cough and shortness of breath.   Cardiovascular: Negative for chest pain, palpitations and leg swelling.  Gastrointestinal: Negative for nausea, vomiting, abdominal pain, diarrhea, constipation, blood in stool and abdominal distention.  Genitourinary: Negative for dysuria, urgency, frequency, hematuria, flank pain, vaginal bleeding, vaginal discharge, difficulty urinating, vaginal pain and pelvic pain.  Musculoskeletal: Negative for joint swelling, arthralgias and gait problem.  Skin: Negative for rash.  Neurological: Negative for dizziness, syncope, speech difficulty, weakness, numbness and headaches.  Hematological: Negative for adenopathy.  Psychiatric/Behavioral: Negative for behavioral problems, dysphoric  mood and agitation. The patient is nervous/anxious.        Objective:   Physical Exam  Constitutional: She is oriented to person, place, and time. She appears well-developed and well-nourished. No distress.  HENT:  Head: Normocephalic.  Right Ear: External ear normal.  Left Ear: External ear normal.  Mouth/Throat: Oropharynx is clear and moist.  Eyes: Conjunctivae and EOM are normal. Pupils are equal, round, and reactive to light.  Neck: Normal range of motion. Neck supple. No thyromegaly present.  Cardiovascular: Normal rate, regular rhythm, normal heart sounds and intact distal pulses.   Pulmonary/Chest: Effort normal and breath sounds normal.  Abdominal: Soft. Bowel sounds are normal. She exhibits no mass. There is no  tenderness.  Musculoskeletal: Normal range of motion.  Lymphadenopathy:    She has no cervical adenopathy.  Neurological: She is alert and oriented to person, place, and time.  Skin: Skin is warm and dry. No rash noted.  Psychiatric: She has a normal mood and affect. Her behavior is normal. Judgment and thought content normal.          Assessment & Plan:   Anxiety disorder.  Well-controlled on present regimen.  Lorazepam refilled.  Metoprolol refilled Follow-up OB/GYN Patient has been asked to mail or fax to the office copies of her future lab draw CPX one year

## 2015-01-26 LAB — LIPID PANEL
Cholesterol: 204 mg/dL — AB (ref 0–200)
HDL: 41 mg/dL (ref 35–70)
LDL Cholesterol: 127 mg/dL
TRIGLYCERIDES: 182 mg/dL — AB (ref 40–160)

## 2015-01-26 LAB — BASIC METABOLIC PANEL
BUN: 14 mg/dL (ref 4–21)
CREATININE: 0.9 mg/dL (ref 0.5–1.1)
GLUCOSE: 102 mg/dL
Potassium: 4.3 mmol/L (ref 3.4–5.3)
SODIUM: 141 mmol/L (ref 137–147)

## 2015-01-26 LAB — CBC AND DIFFERENTIAL
HEMATOCRIT: 42 % (ref 36–46)
HEMOGLOBIN: 13.4 g/dL (ref 12.0–16.0)
NEUTROS ABS: 4 /uL
PLATELETS: 304 10*3/uL (ref 150–399)
WBC: 6.8 10^3/mL

## 2015-01-26 LAB — HEPATIC FUNCTION PANEL
ALK PHOS: 76 U/L (ref 25–125)
ALT: 26 U/L (ref 7–35)
AST: 23 U/L (ref 13–35)
Bilirubin, Total: 0.5 mg/dL

## 2015-01-26 LAB — TSH: TSH: 2.51 u[IU]/mL (ref 0.41–5.90)

## 2015-01-26 LAB — HEMOGLOBIN A1C: Hgb A1c MFr Bld: 5.4 % (ref 4.0–6.0)

## 2015-01-31 ENCOUNTER — Other Ambulatory Visit: Payer: Self-pay | Admitting: Internal Medicine

## 2015-02-06 ENCOUNTER — Encounter: Payer: Self-pay | Admitting: Internal Medicine

## 2015-06-01 ENCOUNTER — Other Ambulatory Visit: Payer: Self-pay | Admitting: Internal Medicine

## 2015-08-01 ENCOUNTER — Other Ambulatory Visit: Payer: Self-pay

## 2015-08-01 DIAGNOSIS — Z1231 Encounter for screening mammogram for malignant neoplasm of breast: Secondary | ICD-10-CM

## 2015-08-18 ENCOUNTER — Ambulatory Visit
Admission: RE | Admit: 2015-08-18 | Discharge: 2015-08-18 | Disposition: A | Discharge status: Home / Self Care | Payer: Commercial Managed Care - PPO | Source: Ambulatory Visit

## 2015-08-18 DIAGNOSIS — Z1231 Encounter for screening mammogram for malignant neoplasm of breast: Secondary | ICD-10-CM

## 2015-09-05 ENCOUNTER — Other Ambulatory Visit: Payer: Self-pay | Admitting: Internal Medicine

## 2015-12-10 ENCOUNTER — Other Ambulatory Visit: Payer: Self-pay | Admitting: Internal Medicine

## 2016-03-05 ENCOUNTER — Other Ambulatory Visit: Payer: Self-pay | Admitting: Internal Medicine

## 2016-05-27 ENCOUNTER — Other Ambulatory Visit: Payer: Self-pay | Admitting: Internal Medicine

## 2016-06-26 ENCOUNTER — Telehealth: Payer: Self-pay | Admitting: Internal Medicine

## 2016-06-26 NOTE — Telephone Encounter (Signed)
Pt need new Rx for lorazepam   Pharm:  [facility]

## 2016-06-27 MED ORDER — LORAZEPAM 0.5 MG PO TABS: ORAL_TABLET | ORAL | 1 refills | Status: DC

## 2016-06-27 NOTE — Telephone Encounter (Signed)
Spoke to pt, told her need to schedule an appt last seen June 2016. Pt said she is scheduled for Jan. Told her okay will call R

## 2016-06-27 NOTE — Telephone Encounter (Signed)
Spoke to pt, told her need to schedule an appt last seen June 2016. Pt said she is scheduled for Jan. Told her okay I will call Rx into the pharmacy 1 month with 1 refill till appt. Pt verbalized understanding. Rx called into pharmacy.

## 2016-06-27 NOTE — Addendum Note (Signed)
Addended by: Marian Sorrow on: 06/27/2016 10:15 AM   Modules accepted: Orders

## 2016-07-25 ENCOUNTER — Encounter: Payer: Self-pay | Admitting: Internal Medicine

## 2016-08-12 ENCOUNTER — Encounter: Payer: Commercial Managed Care - PPO | Admitting: Internal Medicine

## 2016-08-12 ENCOUNTER — Ambulatory Visit: Payer: Commercial Managed Care - PPO | Admitting: Internal Medicine

## 2016-08-13 ENCOUNTER — Ambulatory Visit (INDEPENDENT_AMBULATORY_CARE_PROVIDER_SITE_OTHER): Payer: Commercial Managed Care - PPO | Admitting: Internal Medicine

## 2016-08-13 ENCOUNTER — Encounter: Payer: Self-pay | Admitting: Internal Medicine

## 2016-08-13 VITALS — BP 136/74 | HR 64 | Temp 97.8°F | Ht 63.5 in | Wt 164.6 lb

## 2016-08-13 DIAGNOSIS — Z0000 Encounter for general adult medical examination without abnormal findings: Secondary | ICD-10-CM | POA: Diagnosis not present

## 2016-08-13 MED ORDER — LORAZEPAM 0.5 MG PO TABS: ORAL_TABLET | ORAL | 1 refills | Status: DC

## 2016-08-13 MED ORDER — DICYCLOMINE HCL 20 MG PO TABS: 20.0000 mg | ORAL_TABLET | ORAL | 5 refills | Status: DC | PRN

## 2016-08-13 MED ORDER — METOPROLOL SUCCINATE ER 100 MG PO TB24: ORAL_TABLET | ORAL | 1 refills | Status: DC

## 2016-08-13 NOTE — Patient Instructions (Addendum)
It is important that you exercise regularly, at least 20 minutes 3 to 4 times per week.  If you develop chest pain or shortness of breath seek  medical attention.  Take a calcium supplement, plus [phone] units of vitamin D   Health Maintenance for Postmenopausal Women Introduction Menopause is a normal process in which your reproductive ability comes to an end. This process happens gradually over a span of months to years, usually between the ages of 56 and 35. Menopause is complete when you have missed 12 consecutive menstrual periods. It is important to talk with your health care provider about some of the most common conditions that affect postmenopausal women, such as heart disease, cancer, and bone loss (osteoporosis). Adopting a healthy lifestyle and getting preventive care can help to promote your health and wellness. Those actions can also lower your chances of developing some of these common conditions. What should I know about menopause? During menopause, you may experience a number of symptoms, such as:  Moderate-to-severe hot flashes.  Night sweats.  Decrease in sex drive.  Mood swings.  Headaches.  Tiredness.  Irritability.  Memory problems.  Insomnia. Choosing to treat or not to treat menopausal changes is an individual decision that you make with your health care provider. What should I know about hormone replacement therapy and supplements? Hormone therapy products are effective for treating symptoms that are associated with menopause, such as hot flashes and night sweats. Hormone replacement carries certain risks, especially as you become older. If you are thinking about using estrogen or estrogen with progestin treatments, discuss the benefits and risks with your health care provider. What should I know about heart disease and stroke? Heart disease, heart attack, and stroke become more likely as you age. This may be due, in part, to the hormonal changes that your  body experiences during menopause. These can affect how your body processes dietary fats, triglycerides, and cholesterol. Heart attack and stroke are both medical emergencies. There are many things that you can do to help prevent heart disease and stroke:  Have your blood pressure checked at least every 1-2 years. High blood pressure causes heart disease and increases the risk of stroke.  If you are 45-63 years old, ask your health care provider if you should take aspirin to prevent a heart attack or a stroke.  Do not use any tobacco products, including cigarettes, chewing tobacco, or electronic cigarettes. If you need help quitting, ask your health care provider.  It is important to eat a healthy diet and maintain a healthy weight.  Be sure to include plenty of vegetables, fruits, low-fat dairy products, and lean protein.  Avoid eating foods that are high in solid fats, added sugars, or salt (sodium).  Get regular exercise. This is one of the most important things that you can do for your health.  Try to exercise for at least 150 minutes each week. The type of exercise that you do should increase your heart rate and make you sweat. This is known as moderate-intensity exercise.  Try to do strengthening exercises at least twice each week. Do these in addition to the moderate-intensity exercise.  Know your numbers.Ask your health care provider to check your cholesterol and your blood glucose. Continue to have your blood tested as directed by your health care provider. What should I know about cancer screening? There are several types of cancer. Take the following steps to reduce your risk and to catch any cancer development as early as possible.  Breast Cancer  Practice breast self-awareness.  This means understanding how your breasts normally appear and feel.  It also means doing regular breast self-exams. Let your health care provider know about any changes, no matter how small.  If  you are 45 or older, have a clinician do a breast exam (clinical breast exam or CBE) every year. Depending on your age, family history, and medical history, it may be recommended that you also have a yearly breast X-ray (mammogram).  If you have a family history of breast cancer, talk with your health care provider about genetic screening.  If you are at high risk for breast cancer, talk with your health care provider about having an MRI and a mammogram every year.  Breast cancer (BRCA) gene test is recommended for women who have family members with BRCA-related cancers. Results of the assessment will determine the need for genetic counseling and BRCA1 and for BRCA2 testing. BRCA-related cancers include these types:  Breast. This occurs in males or females.  Ovarian.  Tubal. This may also be called fallopian tube cancer.  Cancer of the abdominal or pelvic lining (peritoneal cancer).  Prostate.  Pancreatic. Cervical, Uterine, and Ovarian Cancer  Your health care provider may recommend that you be screened regularly for cancer of the pelvic organs. These include your ovaries, uterus, and vagina. This screening involves a pelvic exam, which includes checking for microscopic changes to the surface of your cervix (Pap test).  For women ages 21-65, health care providers may recommend a pelvic exam and a Pap test every three years. For women ages 47-65, they may recommend the Pap test and pelvic exam, combined with testing for human papilloma virus (HPV), every five years. Some types of HPV increase your risk of cervical cancer. Testing for HPV may also be done on women of any age who have unclear Pap test results.  Other health care providers may not recommend any screening for nonpregnant women who are considered low risk for pelvic cancer and have no symptoms. Ask your health care provider if a screening pelvic exam is right for you.  If you have had past treatment for cervical cancer or a  condition that could lead to cancer, you need Pap tests and screening for cancer for at least 20 years after your treatment. If Pap tests have been discontinued for you, your risk factors (such as having a new sexual partner) need to be reassessed to determine if you should start having screenings again. Some women have medical problems that increase the chance of getting cervical cancer. In these cases, your health care provider may recommend that you have screening and Pap tests more often.  If you have a family history of uterine cancer or ovarian cancer, talk with your health care provider about genetic screening.  If you have vaginal bleeding after reaching menopause, tell your health care provider.  There are currently no reliable tests available to screen for ovarian cancer. Lung Cancer  Lung cancer screening is recommended for adults 26-54 years old who are at high risk for lung cancer because of a history of smoking. A yearly low-dose CT scan of the lungs is recommended if you:  Currently smoke.  Have a history of at least 30 pack-years of smoking and you currently smoke or have quit within the past 15 years. A pack-year is smoking an average of one pack of cigarettes per day for one year. Yearly screening should:  Continue until it has been 15 years since you  quit.  Stop if you develop a health problem that would prevent you from having lung cancer treatment. Colorectal Cancer  This type of cancer can be detected and can often be prevented.  Routine colorectal cancer screening usually begins at age 6 and continues through age 61.  If you have risk factors for colon cancer, your health care provider may recommend that you be screened at an earlier age.  If you have a family history of colorectal cancer, talk with your health care provider about genetic screening.  Your health care provider may also recommend using home test kits to check for hidden blood in your stool.  A  small camera at the end of a tube can be used to examine your colon directly (sigmoidoscopy or colonoscopy). This is done to check for the earliest forms of colorectal cancer.  Direct examination of the colon should be repeated every 5-10 years until age 62. However, if early forms of precancerous polyps or small growths are found or if you have a family history or genetic risk for colorectal cancer, you may need to be screened more often. Skin Cancer  Check your skin from head to toe regularly.  Monitor any moles. Be sure to tell your health care provider:  About any new moles or changes in moles, especially if there is a change in a mole's shape or color.  If you have a mole that is larger than the size of a pencil eraser.  If any of your family members has a history of skin cancer, especially at a young age, talk with your health care provider about genetic screening.  Always use sunscreen. Apply sunscreen liberally and repeatedly throughout the day.  Whenever you are outside, protect yourself by wearing long sleeves, pants, a wide-brimmed hat, and sunglasses. What should I know about osteoporosis? Osteoporosis is a condition in which bone destruction happens more quickly than new bone creation. After menopause, you may be at an increased risk for osteoporosis. To help prevent osteoporosis or the bone fractures that can happen because of osteoporosis, the following is recommended:  If you are 85-36 years old, get at least 1,000 mg of calcium and at least 600 mg of vitamin D per day.  If you are older than age 58 but younger than age 33, get at least 1,200 mg of calcium and at least 600 mg of vitamin D per day.  If you are older than age 64, get at least 1,200 mg of calcium and at least 800 mg of vitamin D per day. Smoking and excessive alcohol intake increase the risk of osteoporosis. Eat foods that are rich in calcium and vitamin D, and do weight-bearing exercises several times each  week as directed by your health care provider. What should I know about how menopause affects my mental health? Depression may occur at any age, but it is more common as you become older. Common symptoms of depression include:  Low or sad mood.  Changes in sleep patterns.  Changes in appetite or eating patterns.  Feeling an overall lack of motivation or enjoyment of activities that you previously enjoyed.  Frequent crying spells. Talk with your health care provider if you think that you are experiencing depression. What should I know about immunizations? It is important that you get and maintain your immunizations. These include:  Tetanus, diphtheria, and pertussis (Tdap) booster vaccine.  Influenza every year before the flu season begins.  Pneumonia vaccine.  Shingles vaccine. Your health care provider may  also recommend other immunizations. This information is not intended to replace advice given to you by your health care provider. Make sure you discuss any questions you have with your health care provider. Document Released: 09/06/2005 Document Revised: 02/02/2016 Document Reviewed: 04/18/2015  2017 Elsevier

## 2016-08-13 NOTE — Progress Notes (Signed)
Subjective:    Patient ID: Kathy Floyd, female    DOB: September 22, 1960, 56 y.o.   MRN: [id number]  HPI  56 year old patient who is seen today for a preventive health examination.  She is followed annually by gynecology.  She did have screening colonoscopy in January 2015.  No real concerns or complaints today She does have a history of an anxiety disorder.  She uses lorazepam very sparingly.  She also uses beta blocker therapy when necessary as an anxiolytic. She feels that she is doing quite well without concerns or complaints.  She does have an annual lab draw at work, which was reviewed today  Past Medical History:  Diagnosis Date  . Abdominal cramping    stress related per ppt  . Anxiety      Social History   Social History  . Marital status: Married    Spouse name: N/A  . Number of children: N/A  . Years of education: N/A   Occupational History  . Not on file.   Social History Main Topics  . Smoking status: Never Smoker  . Smokeless tobacco: Never Used  . Alcohol use Yes     Comment: occasionally  . Drug use: No  . Sexual activity: Not on file   Other Topics Concern  . Not on file   Social History Narrative  . No narrative on file    Past Surgical History:  Procedure Laterality Date  . NASAL SINUS SURGERY     x2    No family history on file.  Allergies  Allergen Reactions  . Sulfacetamide Sodium Rash    Current Outpatient Prescriptions on File Prior to Visit  Medication Sig Dispense Refill  . Ascorbic Acid (VITAMIN C PO) Take 1 tablet by mouth 3 (three) times a week.      . calcium carbonate (CALCIUM 500) 1250 MG tablet Take 1 tablet by mouth once a week.      . Coenzyme Q10 (CO Q 10 PO) Take 1 capsule by mouth 3 (three) times a week.      . diphenhydrAMINE (BENADRYL) 25 MG tablet Take 25 mg by mouth at bedtime as needed. sleep     . Drospirenone-Ethinyl Estradiol (YASMIN 28 PO) Take 1 tablet by mouth daily.      . fish oil-omega-3 fatty acids  1000 MG capsule Take 1 g by mouth once a week.      Marland Kitchen LYSINE PO Take 1 tablet by mouth as needed.     . Multiple Vitamin (MULTIVITAMIN) tablet Take 1 tablet by mouth daily.       No current facility-administered medications on file prior to visit.     BP 136/74 (BP Location: Right Arm, Patient Position: Sitting, Cuff Size: Normal)   Pulse 64   Temp 97.8 F (36.6 C) (Oral)   Ht 5' 3.5" (1.613 m)   Wt 164 lb 9.6 oz (74.7 kg)   SpO2 95%   BMI 28.70 kg/m     Review of Systems  Constitutional: Negative.   HENT: Negative for congestion, dental problem, hearing loss, rhinorrhea, sinus pressure, sore throat and tinnitus.   Eyes: Negative for pain, discharge and visual disturbance.  Respiratory: Negative for cough and shortness of breath.   Cardiovascular: Negative for chest pain, palpitations and leg swelling.  Gastrointestinal: Negative for abdominal distention, abdominal pain, blood in stool, constipation, diarrhea, nausea and vomiting.  Genitourinary: Negative for difficulty urinating, dysuria, flank pain, frequency, hematuria, pelvic pain, urgency, vaginal bleeding, vaginal discharge  and vaginal pain.  Musculoskeletal: Negative for arthralgias, gait problem and joint swelling.  Skin: Negative for rash.  Neurological: Negative for dizziness, syncope, speech difficulty, weakness, numbness and headaches.  Hematological: Negative for adenopathy.  Psychiatric/Behavioral: Negative for agitation, behavioral problems and dysphoric mood. The patient is not nervous/anxious.        Objective:   Physical Exam  Constitutional: She is oriented to person, place, and time. She appears well-developed and well-nourished.  HENT:  Head: Normocephalic and atraumatic.  Right Ear: External ear normal.  Left Ear: External ear normal.  Mouth/Throat: Oropharynx is clear and moist.  Eyes: Conjunctivae and EOM are normal.  Neck: Normal range of motion. Neck supple. No JVD present. No thyromegaly present.   Cardiovascular: Normal rate, regular rhythm, normal heart sounds and intact distal pulses.   No murmur heard. Left dorsalis pedis pulses appeared to be slightly diminished  Pulmonary/Chest: Effort normal and breath sounds normal. She has no wheezes. She has no rales.  Abdominal: Soft. Bowel sounds are normal. She exhibits no distension and no mass. There is no tenderness. There is no rebound and no guarding.  Genitourinary: Vagina normal.  Musculoskeletal: Normal range of motion. She exhibits no edema or tenderness.  Neurological: She is alert and oriented to person, place, and time. She has normal reflexes. No cranial nerve deficit. She exhibits normal muscle tone. Coordination normal.  Skin: Skin is warm and dry. No rash noted.  Psychiatric: She has a normal mood and affect. Her behavior is normal.          Assessment & Plan:   Preventive health examination.  Laboratory studies reviewed.  Will follow-up with gynecology.  Annual mammogram scheduled for later this month .  Recent history of basal cell skin cancer.  Follow-up dermatology.  Vitamin D supplements.  Recommended Return here in one year or as needed  [employer]

## 2016-08-13 NOTE — Progress Notes (Signed)
Pre visit review using our clinic review tool, if applicable. No additional management support is needed unless otherwise documented below in the visit note. 

## 2016-08-26 ENCOUNTER — Encounter: Payer: Self-pay | Admitting: Internal Medicine

## 2016-09-13 ENCOUNTER — Other Ambulatory Visit: Payer: Self-pay | Admitting: Obstetrics and Gynecology

## 2016-09-13 DIAGNOSIS — Z1231 Encounter for screening mammogram for malignant neoplasm of breast: Secondary | ICD-10-CM | POA: Diagnosis not present

## 2016-09-13 DIAGNOSIS — Z124 Encounter for screening for malignant neoplasm of cervix: Secondary | ICD-10-CM | POA: Diagnosis not present

## 2016-09-13 DIAGNOSIS — Z01419 Encounter for gynecological examination (general) (routine) without abnormal findings: Secondary | ICD-10-CM | POA: Diagnosis not present

## 2016-09-16 LAB — CYTOLOGY - PAP

## 2016-09-17 ENCOUNTER — Other Ambulatory Visit: Payer: Self-pay | Admitting: Internal Medicine

## 2016-09-18 ENCOUNTER — Telehealth: Payer: Self-pay | Admitting: Internal Medicine

## 2016-09-18 MED ORDER — LORAZEPAM 0.5 MG PO TABS: ORAL_TABLET | ORAL | 1 refills | Status: DC

## 2016-09-18 NOTE — Telephone Encounter (Signed)
° ° ° ° °  Pt request refill of the following:  LORazepam (ATIVAN) 0.5 MG tablet   Phamacy:  [facility]

## 2016-09-18 NOTE — Telephone Encounter (Signed)
Medication refill was phoned in to pharmacy.

## 2016-10-04 DIAGNOSIS — L900 Lichen sclerosus et atrophicus: Secondary | ICD-10-CM | POA: Diagnosis not present

## 2016-10-04 DIAGNOSIS — C44719 Basal cell carcinoma of skin of left lower limb, including hip: Secondary | ICD-10-CM | POA: Diagnosis not present

## 2016-10-23 ENCOUNTER — Encounter: Payer: Self-pay | Admitting: Family Medicine

## 2016-11-15 ENCOUNTER — Other Ambulatory Visit: Payer: Self-pay | Admitting: Internal Medicine

## 2016-11-18 MED ORDER — LORAZEPAM 0.5 MG PO TABS: 0.5000 mg | ORAL_TABLET | Freq: Two times a day (BID) | ORAL | 1 refills | Status: DC | PRN

## 2016-11-18 NOTE — Telephone Encounter (Signed)
Okay for refill?  

## 2016-11-18 NOTE — Telephone Encounter (Signed)
Was not intended to print.  Called to the pharmacy and left on machine.

## 2016-11-18 NOTE — Telephone Encounter (Signed)
Printed for Dr. Raliegh Ip to sign.

## 2016-11-18 NOTE — Telephone Encounter (Signed)
Last filled on 09/18/16 for 2 months Had yearly on 08/13/16 No follow up scheduled at this time. Please advise.  Thanks!!

## 2016-12-03 ENCOUNTER — Other Ambulatory Visit: Payer: Self-pay | Admitting: Internal Medicine

## 2016-12-03 MED ORDER — METOPROLOL SUCCINATE ER 100 MG PO TB24: ORAL_TABLET | ORAL | 3 refills | Status: DC

## 2016-12-06 ENCOUNTER — Telehealth: Payer: Self-pay | Admitting: Internal Medicine

## 2016-12-06 NOTE — Telephone Encounter (Signed)
[facility] rx needs clarification on metoprolol 100 mg. Pharm would like to know if med is for anxiety or bp

## 2016-12-09 NOTE — Telephone Encounter (Signed)
Per chart note LOV: "She does have a history of an anxiety disorder.  She uses lorazepam very sparingly.  She also uses beta blocker therapy when necessary as an anxiolytic."

## 2016-12-09 NOTE — Telephone Encounter (Signed)
Spoke with Levada Dy at [facility] and clarified prescription. Nothing further needed at this time.

## 2016-12-31 DIAGNOSIS — L821 Other seborrheic keratosis: Secondary | ICD-10-CM | POA: Diagnosis not present

## 2017-06-30 DIAGNOSIS — D239 Other benign neoplasm of skin, unspecified: Secondary | ICD-10-CM | POA: Diagnosis not present

## 2017-06-30 DIAGNOSIS — D485 Neoplasm of uncertain behavior of skin: Secondary | ICD-10-CM | POA: Diagnosis not present

## 2017-06-30 DIAGNOSIS — L738 Other specified follicular disorders: Secondary | ICD-10-CM | POA: Diagnosis not present

## 2017-06-30 DIAGNOSIS — D1801 Hemangioma of skin and subcutaneous tissue: Secondary | ICD-10-CM | POA: Diagnosis not present

## 2017-06-30 DIAGNOSIS — L0102 Bockhart's impetigo: Secondary | ICD-10-CM | POA: Diagnosis not present

## 2017-06-30 DIAGNOSIS — L918 Other hypertrophic disorders of the skin: Secondary | ICD-10-CM | POA: Diagnosis not present

## 2017-09-09 DIAGNOSIS — L570 Actinic keratosis: Secondary | ICD-10-CM | POA: Diagnosis not present

## 2017-09-21 ENCOUNTER — Other Ambulatory Visit: Payer: Self-pay | Admitting: Internal Medicine

## 2017-10-08 DIAGNOSIS — Z1231 Encounter for screening mammogram for malignant neoplasm of breast: Secondary | ICD-10-CM | POA: Diagnosis not present

## 2017-10-08 DIAGNOSIS — Z01419 Encounter for gynecological examination (general) (routine) without abnormal findings: Secondary | ICD-10-CM | POA: Diagnosis not present

## 2017-10-08 LAB — HM MAMMOGRAPHY

## 2017-11-17 ENCOUNTER — Other Ambulatory Visit: Payer: Self-pay | Admitting: Internal Medicine

## 2017-11-17 NOTE — Telephone Encounter (Signed)
Copied from [place] [id number]. Topic: Quick Communication - Rx Refill/Question >> Nov 17, 2017 11:57 AM Arletha Grippe wrote: Medication: LORazepam (ATIVAN) 0.5 MG tablet Has the patient contacted their pharmacy? No. (Agent: If no, request that the patient contact the pharmacy for the refill.) Preferred Pharmacy (with phone number or street name): [facility]   Agent: Please be advised that RX refills may take up to 3 business days. We ask that you follow-up with your pharmacy.

## 2017-11-17 NOTE — Telephone Encounter (Signed)
Request for refill of Ativan Last refill 11/18/16 # 60 wit 1 refill  LOV: 08/13/16  Dr. Burnice Logan  [facility]

## 2017-11-18 DIAGNOSIS — N800 Endometriosis of uterus: Secondary | ICD-10-CM | POA: Diagnosis not present

## 2017-11-20 ENCOUNTER — Other Ambulatory Visit: Payer: Self-pay | Admitting: Internal Medicine

## 2017-11-21 ENCOUNTER — Telehealth: Payer: Self-pay

## 2017-11-21 MED ORDER — LORAZEPAM 0.5 MG PO TABS: 0.5000 mg | ORAL_TABLET | Freq: Two times a day (BID) | ORAL | 1 refills | Status: DC | PRN

## 2017-12-24 ENCOUNTER — Encounter: Payer: Self-pay | Admitting: Internal Medicine

## 2017-12-24 ENCOUNTER — Ambulatory Visit: Payer: Commercial Managed Care - PPO | Admitting: Internal Medicine

## 2017-12-24 VITALS — BP 90/70 | HR 67 | Temp 98.9°F | Wt 151.0 lb

## 2017-12-24 DIAGNOSIS — F411 Generalized anxiety disorder: Secondary | ICD-10-CM

## 2017-12-24 MED ORDER — LORAZEPAM 0.5 MG PO TABS: 0.5000 mg | ORAL_TABLET | Freq: Two times a day (BID) | ORAL | 1 refills | Status: DC | PRN

## 2017-12-24 MED ORDER — DICYCLOMINE HCL 20 MG PO TABS: 20.0000 mg | ORAL_TABLET | ORAL | 5 refills | Status: DC | PRN

## 2017-12-24 MED ORDER — METOPROLOL SUCCINATE ER 100 MG PO TB24: ORAL_TABLET | ORAL | 3 refills | Status: DC

## 2017-12-24 NOTE — Progress Notes (Signed)
Subjective:    Patient ID: Kathy Floyd, female    DOB: 1960/09/06, 57 y.o.   MRN: [id number]  HPI  57 year old patient who is seen today for follow-up.  She has a history of a chronic anxiety disorder.  She uses a daily beta-blocker as an anxiolytic which has been quite helpful.  She uses lorazepam very rarely.  In general doing quite well;  she has recent return from a trip to the beach.  She generally has a annual lab draw at work.  Past Medical History:  Diagnosis Date  . Abdominal cramping    stress related per ppt  . Anxiety      Social History   Socioeconomic History  . Marital status: Married    Spouse name: Not on file  . Number of children: Not on file  . Years of education: Not on file  . Highest education level: Not on file  Occupational History  . Not on file  Social Needs  . Financial resource strain: Not on file  . Food insecurity:    Worry: Not on file    Inability: Not on file  . Transportation needs:    Medical: Not on file    Non-medical: Not on file  Tobacco Use  . Smoking status: Never Smoker  . Smokeless tobacco: Never Used  Substance and Sexual Activity  . Alcohol use: Yes    Comment: occasionally  . Drug use: No  . Sexual activity: Not on file  Lifestyle  . Physical activity:    Days per week: Not on file    Minutes per session: Not on file  . Stress: Not on file  Relationships  . Social connections:    Talks on phone: Not on file    Gets together: Not on file    Attends religious service: Not on file    Active member of club or organization: Not on file    Attends meetings of clubs or organizations: Not on file    Relationship status: Not on file  . Intimate partner violence:    Fear of current or ex partner: Not on file    Emotionally abused: Not on file    Physically abused: Not on file    Forced sexual activity: Not on file  Other Topics Concern  . Not on file  Social History Narrative  . Not on file    Past  Surgical History:  Procedure Laterality Date  . NASAL SINUS SURGERY     x2    History reviewed. No pertinent family history.  Allergies  Allergen Reactions  . Sulfa Antibiotics Hives  . Sulfacetamide Sodium Rash    Current Outpatient Medications on File Prior to Visit  Medication Sig Dispense Refill  . Ascorbic Acid (VITAMIN C PO) Take 1 tablet by mouth 3 (three) times a week.      . calcium carbonate (CALCIUM 500) 1250 MG tablet Take 1 tablet by mouth once a week.      . Coenzyme Q10 (CO Q 10 PO) Take 1 capsule by mouth 3 (three) times a week.      . diphenhydrAMINE (BENADRYL) 25 MG tablet Take 25 mg by mouth at bedtime as needed. sleep     . fish oil-omega-3 fatty acids 1000 MG capsule Take 1 g by mouth once a week.      Marland Kitchen LYSINE PO Take 1 tablet by mouth as needed.     . Multiple Vitamin (MULTIVITAMIN) tablet Take 1 tablet by  mouth daily.       No current facility-administered medications on file prior to visit.     BP 90/70 (BP Location: Right Arm, Patient Position: Sitting, Cuff Size: Normal)   Pulse 67   Temp 98.9 F (37.2 C) (Oral)   Wt 151 lb (68.5 kg)   SpO2 99%   BMI 26.33 kg/m     Review of Systems  Constitutional: Negative.   HENT: Negative for congestion, dental problem, hearing loss, rhinorrhea, sinus pressure, sore throat and tinnitus.   Eyes: Negative for pain, discharge and visual disturbance.  Respiratory: Negative for cough and shortness of breath.   Cardiovascular: Negative for chest pain, palpitations and leg swelling.  Gastrointestinal: Negative for abdominal distention, abdominal pain, blood in stool, constipation, diarrhea, nausea and vomiting.  Genitourinary: Negative for difficulty urinating, dysuria, flank pain, frequency, hematuria, pelvic pain, urgency, vaginal bleeding, vaginal discharge and vaginal pain.  Musculoskeletal: Negative for arthralgias, gait problem and joint swelling.  Skin: Negative for rash.  Neurological: Negative for  dizziness, syncope, speech difficulty, weakness, numbness and headaches.  Hematological: Negative for adenopathy.  Psychiatric/Behavioral: Negative for agitation, behavioral problems and dysphoric mood. The patient is nervous/anxious.        Objective:   Physical Exam  Constitutional: She is oriented to person, place, and time. She appears well-developed and well-nourished.  HENT:  Head: Normocephalic.  Right Ear: External ear normal.  Left Ear: External ear normal.  Mouth/Throat: Oropharynx is clear and moist.  Eyes: Pupils are equal, round, and reactive to light. Conjunctivae and EOM are normal.  Neck: Normal range of motion. Neck supple. No thyromegaly present.  Cardiovascular: Normal rate, regular rhythm, normal heart sounds and intact distal pulses.  Pulmonary/Chest: Effort normal and breath sounds normal.  Abdominal: Soft. Bowel sounds are normal. She exhibits no mass. There is no tenderness.  Musculoskeletal: Normal range of motion.  Lymphadenopathy:    She has no cervical adenopathy.  Neurological: She is alert and oriented to person, place, and time.  Skin: Skin is warm and dry. No rash noted.  Psychiatric: She has a normal mood and affect. Her behavior is normal.          Assessment & Plan:   Anxiety disorder.  Continue beta-blocker therapy as needed.  Medications updated  Suggested patient establish with a new PCP for annual exam within the next 6-12  months  Dayden Viverette Pilar Plate

## 2017-12-24 NOTE — Patient Instructions (Signed)
It is important that you exercise regularly, at least 20 minutes 3 to 4 times per week.  If you develop chest pain or shortness of breath seek  medical attention.  Return in one year for follow-up  Please mail or fax to our office copies of your annual laboratory screen from work

## 2017-12-31 ENCOUNTER — Telehealth: Payer: Self-pay | Admitting: Internal Medicine

## 2017-12-31 NOTE — Telephone Encounter (Signed)
Copied from [place] [id number]. Topic: Quick Communication - Rx Refill/Question >> Dec 31, 2017  2:59 PM Keene Breath wrote: Medication: dicyclomine (BENTYL) 20 MG tablet  Vern M from [facility] called requesting clarification on a prescription that they received.  CB # [phone], Ref# [id number]

## 2018-02-03 DIAGNOSIS — M9903 Segmental and somatic dysfunction of lumbar region: Secondary | ICD-10-CM | POA: Diagnosis not present

## 2018-02-05 DIAGNOSIS — M9903 Segmental and somatic dysfunction of lumbar region: Secondary | ICD-10-CM | POA: Diagnosis not present

## 2018-02-09 DIAGNOSIS — M9903 Segmental and somatic dysfunction of lumbar region: Secondary | ICD-10-CM | POA: Diagnosis not present

## 2018-02-11 DIAGNOSIS — M9903 Segmental and somatic dysfunction of lumbar region: Secondary | ICD-10-CM | POA: Diagnosis not present

## 2018-02-12 DIAGNOSIS — M9903 Segmental and somatic dysfunction of lumbar region: Secondary | ICD-10-CM | POA: Diagnosis not present

## 2018-02-16 DIAGNOSIS — M9903 Segmental and somatic dysfunction of lumbar region: Secondary | ICD-10-CM | POA: Diagnosis not present

## 2018-02-18 DIAGNOSIS — M9903 Segmental and somatic dysfunction of lumbar region: Secondary | ICD-10-CM | POA: Diagnosis not present

## 2018-02-19 ENCOUNTER — Other Ambulatory Visit: Payer: Self-pay

## 2018-02-20 NOTE — Telephone Encounter (Signed)
Chart entered in error

## 2018-02-23 DIAGNOSIS — M9903 Segmental and somatic dysfunction of lumbar region: Secondary | ICD-10-CM | POA: Diagnosis not present

## 2018-02-25 DIAGNOSIS — M9903 Segmental and somatic dysfunction of lumbar region: Secondary | ICD-10-CM | POA: Diagnosis not present

## 2018-03-02 DIAGNOSIS — M9903 Segmental and somatic dysfunction of lumbar region: Secondary | ICD-10-CM | POA: Diagnosis not present

## 2018-03-04 DIAGNOSIS — M9903 Segmental and somatic dysfunction of lumbar region: Secondary | ICD-10-CM | POA: Diagnosis not present

## 2018-03-09 DIAGNOSIS — M9903 Segmental and somatic dysfunction of lumbar region: Secondary | ICD-10-CM | POA: Diagnosis not present

## 2018-03-16 DIAGNOSIS — M9903 Segmental and somatic dysfunction of lumbar region: Secondary | ICD-10-CM | POA: Diagnosis not present

## 2018-03-31 DIAGNOSIS — M9903 Segmental and somatic dysfunction of lumbar region: Secondary | ICD-10-CM | POA: Diagnosis not present

## 2018-04-04 ENCOUNTER — Other Ambulatory Visit: Payer: Self-pay | Admitting: Internal Medicine

## 2018-04-06 NOTE — Telephone Encounter (Signed)
Okay for refill? Please advise 

## 2018-04-07 DIAGNOSIS — N800 Endometriosis of uterus: Secondary | ICD-10-CM | POA: Diagnosis not present

## 2018-04-08 DIAGNOSIS — M9903 Segmental and somatic dysfunction of lumbar region: Secondary | ICD-10-CM | POA: Diagnosis not present

## 2018-04-15 DIAGNOSIS — M9903 Segmental and somatic dysfunction of lumbar region: Secondary | ICD-10-CM | POA: Diagnosis not present

## 2018-05-04 DIAGNOSIS — M9903 Segmental and somatic dysfunction of lumbar region: Secondary | ICD-10-CM | POA: Diagnosis not present

## 2018-05-21 DIAGNOSIS — M9903 Segmental and somatic dysfunction of lumbar region: Secondary | ICD-10-CM | POA: Diagnosis not present

## 2018-06-11 DIAGNOSIS — M9903 Segmental and somatic dysfunction of lumbar region: Secondary | ICD-10-CM | POA: Diagnosis not present

## 2018-06-23 ENCOUNTER — Other Ambulatory Visit: Payer: Self-pay | Admitting: Internal Medicine

## 2018-07-01 DIAGNOSIS — L820 Inflamed seborrheic keratosis: Secondary | ICD-10-CM | POA: Diagnosis not present

## 2018-07-02 ENCOUNTER — Other Ambulatory Visit: Payer: Self-pay | Admitting: Internal Medicine

## 2018-07-03 NOTE — Telephone Encounter (Addendum)
Pt following up on this refill request.  Advised pt she needs an appt to establish with a new pcp Pt declined to make appt to establish with a PCP.  Pt states if we will not refill, she needs to speak with a nurse or office manager. because she only comes in one time a year. Kathy Floyd took the call.

## 2018-07-03 NOTE — Telephone Encounter (Signed)
Pt is very upset about her medication refill being denied. Asked pt if she received letter about Dr Marthann Schiller retirement and recommendation to choose a new PCP, she said yes but she just hadn't done it yet. I attempted to explain the reason she was asked to choose a new PCP, but pt kept talking over me and would not let me speak. I finally advised the pt if the conversation was going to continue with her speaking over me then we would stop the conversation. She then allowed me to continue to explain that she would need to have a new PCP in order to continue to receive her medication. She states she has been seeing Dr. Raliegh Ip "since 1986" and she does not see a reason why she should have to have a new provider. I explained that Dr. Raliegh Ip is no longer in practice here so he cannot prescribe medications. She would need to establish care with a new provider in order to continue to receive her medication. She continued to argue with me. I eventually asked her if she would like to make an appt with one of our providers, she asked for "the youngest" one... I offered Dr. Ethlyn Gallery. She accepted and has been scheduled for 07/08/18. She asked if this "would be a free visit" to which I replied no it would fall under her regular co-pay based on her insurance benefits. She was not pleased with this either. Advised pt to arrive 15 mins prior to appt for check-in.

## 2018-07-08 ENCOUNTER — Encounter: Payer: Self-pay | Admitting: Family Medicine

## 2018-07-08 ENCOUNTER — Ambulatory Visit: Payer: Commercial Managed Care - PPO | Admitting: Family Medicine

## 2018-07-08 ENCOUNTER — Other Ambulatory Visit: Payer: Self-pay | Admitting: Family Medicine

## 2018-07-08 VITALS — BP 110/60 | HR 68 | Temp 98.0°F | Wt 158.3 lb

## 2018-07-08 DIAGNOSIS — F419 Anxiety disorder, unspecified: Secondary | ICD-10-CM

## 2018-07-08 DIAGNOSIS — M9903 Segmental and somatic dysfunction of lumbar region: Secondary | ICD-10-CM | POA: Diagnosis not present

## 2018-07-08 DIAGNOSIS — R7401 Elevation of levels of liver transaminase levels: Secondary | ICD-10-CM

## 2018-07-08 DIAGNOSIS — R740 Nonspecific elevation of levels of transaminase and lactic acid dehydrogenase [LDH]: Secondary | ICD-10-CM | POA: Diagnosis not present

## 2018-07-08 MED ORDER — LORAZEPAM 0.5 MG PO TABS: 0.5000 mg | ORAL_TABLET | Freq: Two times a day (BID) | ORAL | 1 refills | Status: DC | PRN

## 2018-07-08 MED ORDER — DICYCLOMINE HCL 20 MG PO TABS: 20.0000 mg | ORAL_TABLET | ORAL | 5 refills | Status: DC | PRN

## 2018-07-08 NOTE — Patient Instructions (Addendum)
Consider vitamin D 1000-2000 units per day.   Have a repeat CMP completed through work and let me know about results. We are looking at the ALT that was elevated.

## 2018-07-08 NOTE — Progress Notes (Signed)
Kathy Floyd DOB: [birth year] Encounter date: 07/08/2018  This is a 57 y.o. female who presents to establish care. Chief Complaint  Patient presents with  . Transitions Of Care    History of present illness: Just needed refills on lorazepam and dicyclomine.   Has been seeing Dr. Raliegh Ip since the 80's.  Sees obgyn, dermatology. Does wellness program through work and does bloodwork through that once a year.  Started seeing Dr. Owens Shark (chiropractor) this year. Lower back pain. Has been seeing him for 4 weeks and pain is nearly completely gone.   Anxiety - Started taking dicyclomine back when she was a teenager. Took when she was having a hard time handling things; when stress was too difficult. Then a few years ago lorazepam was added along with toprol. Those really helped her out. Takes the metoprolol daily. Others are just as needed. Still has some of rx left from July - uses typically just takes 1-2 in a day when needed. Takes 2-3 times/week. Some weeks not at all. Not tried anything else besides these.   Getting mammogram through obgyn. Up to date per patient.   Declines flu shot.   Past Medical History:  Diagnosis Date  . Abdominal cramping    stress related per ppt  . Anxiety   . Endometriosis   . Salmonella infection    Past Surgical History:  Procedure Laterality Date  . LAPAROSCOPIC ENDOMETRIOSIS FULGURATION  2014  . NASAL SINUS SURGERY     x2   Allergies  Allergen Reactions  . Sulfa Antibiotics Hives  . Sulfacetamide Sodium Rash   Current Meds  Medication Sig  . Ascorbic Acid (VITAMIN C PO) Take 1 tablet by mouth 3 (three) times a week.    . calcium carbonate (CALCIUM 500) 1250 MG tablet Take 1 tablet by mouth once a week.    . Coenzyme Q10 (CO Q 10 PO) Take 1 capsule by mouth 3 (three) times a week.    . dicyclomine (BENTYL) 20 MG tablet Take 1 tablet (20 mg total) by mouth as needed. stress  . diphenhydrAMINE (BENADRYL) 25 MG tablet Take 25 mg by mouth at  bedtime as needed. sleep   . fish oil-omega-3 fatty acids 1000 MG capsule Take 1 g by mouth once a week.    Marland Kitchen LORazepam (ATIVAN) 0.5 MG tablet Take 1 tablet (0.5 mg total) by mouth 2 (two) times daily as needed.  Marland Kitchen LYSINE PO Take 1 tablet by mouth as needed.   . metoprolol succinate (TOPROL-XL) 100 MG 24 hr tablet TAKE 1 TABLET EVERY DAY AS  NEEDED FOR ANXIETY  . Multiple Vitamin (MULTIVITAMIN) tablet Take 1 tablet by mouth daily.    . [DISCONTINUED] dicyclomine (BENTYL) 20 MG tablet Take 1 tablet (20 mg total) by mouth as needed. stress  . [DISCONTINUED] dicyclomine (BENTYL) 20 MG tablet TAKE 1 TABLET (20 MG TOTAL) BY MOUTH AS NEEDED. STRESS  . [DISCONTINUED] LORazepam (ATIVAN) 0.5 MG tablet Take 1 tablet (0.5 mg total) by mouth 2 (two) times daily as needed.   Social History   Tobacco Use  . Smoking status: Never Smoker  . Smokeless tobacco: Never Used  Substance Use Topics  . Alcohol use: Yes    Comment: occasionally   Family History  Problem Relation Age of Onset  . Leukemia Mother        monitoring  . Heart attack Father 11  . Alcohol abuse Maternal Grandmother   . Heart disease Maternal Grandfather   . Stroke Maternal  Grandfather   . Other Paternal Grandmother        auto accident  . Heart attack Paternal Grandfather      Review of Systems  Constitutional: Negative for chills, fatigue and fever.  Respiratory: Negative for cough, chest tightness, shortness of breath and wheezing.   Cardiovascular: Negative for chest pain, palpitations and leg swelling.  Gastrointestinal: Negative for abdominal distention, abdominal pain, constipation, diarrhea, nausea and vomiting.  Psychiatric/Behavioral: The patient is nervous/anxious.     Objective:  BP 110/60 (BP Location: Left Arm, Patient Position: Sitting, Cuff Size: Normal)   Pulse 68   Temp 98 F (36.7 C) (Oral)   Wt 158 lb 4.8 oz (71.8 kg)   SpO2 98%   BMI 27.60 kg/m   Weight: 158 lb 4.8 oz (71.8 kg)   BP Readings  from Last 3 Encounters:  07/08/18 110/60  12/24/17 90/70  08/13/16 136/74   Wt Readings from Last 3 Encounters:  07/08/18 158 lb 4.8 oz (71.8 kg)  12/24/17 151 lb (68.5 kg)  08/13/16 164 lb 9.6 oz (74.7 kg)    Physical Exam  Constitutional: She is oriented to person, place, and time. She appears well-developed and well-nourished. No distress.  Cardiovascular: Normal rate, regular rhythm and normal heart sounds. Exam reveals no friction rub.  No murmur heard. No lower extremity edema  Pulmonary/Chest: Effort normal and breath sounds normal. No respiratory distress. She has no wheezes. She has no rales.  Abdominal: Soft. Bowel sounds are normal. She exhibits no distension and no mass. There is no tenderness. There is no guarding.  Neurological: She is alert and oriented to person, place, and time.  Psychiatric: She has a normal mood and affect. Her speech is normal and behavior is normal. Cognition and memory are normal.    Assessment/Plan:  1. Elevated ALT measurement She gets bloodwork free through work; so will complete this at work. Advised repeat CMP.   2. Anxiety Stable. Discussed limiting lorazepam to "emergency" type med and risks of increased use. Continue to use sparingly and follow up ok in 6 months time.  - dicyclomine (BENTYL) 20 MG tablet; Take 1 tablet (20 mg total) by mouth as needed. stress  Dispense: 30 tablet; Refill: 5 - LORazepam (ATIVAN) 0.5 MG tablet; Take 1 tablet (0.5 mg total) by mouth 2 (two) times daily as needed.  Dispense: 60 tablet; Refill: 1   Return in about 6 months (around 01/07/2019) for Chronic condition visit.  Micheline Rough, MD

## 2018-07-10 ENCOUNTER — Encounter: Payer: Self-pay | Admitting: Family Medicine

## 2018-07-20 LAB — LIPID PANEL
Cholesterol: 200 (ref 0–200)
HDL: 39 (ref 35–70)
LDL Cholesterol: 126
Triglycerides: 174 — AB (ref 40–160)

## 2018-07-20 LAB — BASIC METABOLIC PANEL
BUN: 12 (ref 4–21)
Creatinine: 0.8 (ref <=1.1)
Glucose: 95
Potassium: 4.4 (ref 3.4–5.3)
Sodium: 143 (ref 137–147)

## 2018-07-20 LAB — HEPATIC FUNCTION PANEL
ALT: 18 (ref 7–35)
AST: 17 (ref 13–35)
Alkaline Phosphatase: 87 (ref 25–125)
Bilirubin, Total: 0.3

## 2018-07-20 LAB — VITAMIN D 25 HYDROXY (VIT D DEFICIENCY, FRACTURES): Vit D, 25-Hydroxy: 23.1

## 2018-08-05 DIAGNOSIS — M9903 Segmental and somatic dysfunction of lumbar region: Secondary | ICD-10-CM | POA: Diagnosis not present

## 2018-09-03 DIAGNOSIS — M9903 Segmental and somatic dysfunction of lumbar region: Secondary | ICD-10-CM | POA: Diagnosis not present

## 2018-09-17 DIAGNOSIS — M9903 Segmental and somatic dysfunction of lumbar region: Secondary | ICD-10-CM | POA: Diagnosis not present

## 2018-10-07 DIAGNOSIS — M9903 Segmental and somatic dysfunction of lumbar region: Secondary | ICD-10-CM | POA: Diagnosis not present

## 2018-10-13 DIAGNOSIS — L820 Inflamed seborrheic keratosis: Secondary | ICD-10-CM | POA: Diagnosis not present

## 2018-10-13 DIAGNOSIS — L814 Other melanin hyperpigmentation: Secondary | ICD-10-CM | POA: Diagnosis not present

## 2018-10-13 DIAGNOSIS — L578 Other skin changes due to chronic exposure to nonionizing radiation: Secondary | ICD-10-CM | POA: Diagnosis not present

## 2018-10-13 DIAGNOSIS — L821 Other seborrheic keratosis: Secondary | ICD-10-CM | POA: Diagnosis not present

## 2018-11-10 DIAGNOSIS — M9903 Segmental and somatic dysfunction of lumbar region: Secondary | ICD-10-CM | POA: Diagnosis not present

## 2018-11-26 DIAGNOSIS — M9903 Segmental and somatic dysfunction of lumbar region: Secondary | ICD-10-CM | POA: Diagnosis not present

## 2018-12-08 ENCOUNTER — Telehealth: Payer: Self-pay | Admitting: *Deleted

## 2018-12-08 NOTE — Telephone Encounter (Signed)
[facility] faxed a refill request on Metoprolol Succ ER tab 100mg .  Message sent to Dr Ethlyn Gallery as last Rx was given by Dr Burnice Logan.

## 2018-12-09 ENCOUNTER — Other Ambulatory Visit: Payer: Self-pay | Admitting: Family Medicine

## 2018-12-09 MED ORDER — METOPROLOL SUCCINATE ER 100 MG PO TB24: ORAL_TABLET | ORAL | 3 refills | Status: DC

## 2018-12-09 NOTE — Telephone Encounter (Signed)
[facility]

## 2018-12-09 NOTE — Telephone Encounter (Signed)
Noted  

## 2019-01-06 ENCOUNTER — Other Ambulatory Visit: Payer: Self-pay | Admitting: Family Medicine

## 2019-01-06 ENCOUNTER — Telehealth: Payer: Self-pay | Admitting: Family Medicine

## 2019-01-06 DIAGNOSIS — F419 Anxiety disorder, unspecified: Secondary | ICD-10-CM

## 2019-01-06 MED ORDER — LORAZEPAM 0.5 MG PO TABS: 0.5000 mg | ORAL_TABLET | Freq: Two times a day (BID) | ORAL | 1 refills | Status: DC | PRN

## 2019-01-06 NOTE — Telephone Encounter (Signed)
I have sent in refill. We just wanted to recheck liver enzymes. She has appt next week anyway so we can discuss then.

## 2019-01-06 NOTE — Telephone Encounter (Signed)
Copied from [place] [id number]. Topic: Quick Communication - Rx Refill/Question >> Jan 06, 2019  3:10 PM Selinda Flavin B, NT wrote: **Patient states that Dr Ethlyn Gallery had requested some labs be done before refilling. Patient states that she had labs done at her work that she will be sending Dr Ethlyn Gallery the results to through [place].**  Medication: LORazepam (ATIVAN) 0.5 MG tablet  Has the patient contacted their pharmacy? yes (Agent: If no, request that the patient contact the pharmacy for the refill.) (Agent: If yes, when and what did the pharmacy advise?)  Preferred Pharmacy (with phone number or street name): [id number] - [place], [place] - [address]  Agent: Please be advised that RX refills may take up to 3 business days. We ask that you follow-up with your pharmacy.

## 2019-01-07 ENCOUNTER — Encounter: Payer: Self-pay | Admitting: Family Medicine

## 2019-01-07 NOTE — Telephone Encounter (Signed)
I called the pt and informed her of the message below.  Patient stated she will not come here for labs as she has had labs done since seeing the doctor last.  States she will download the results and forward to Dr Ethlyn Gallery for review.  Patient requested the appt for 6/12 be cancelled and this was done.

## 2019-01-08 ENCOUNTER — Ambulatory Visit: Payer: Commercial Managed Care - PPO | Admitting: Family Medicine

## 2019-01-08 ENCOUNTER — Encounter: Payer: Self-pay | Admitting: Family Medicine

## 2019-01-08 NOTE — Telephone Encounter (Signed)
Can you abstract these lab values into chart?

## 2019-10-07 ENCOUNTER — Ambulatory Visit: Payer: Managed Care, Other (non HMO) | Attending: Internal Medicine

## 2019-10-07 DIAGNOSIS — Z23 Encounter for immunization: Secondary | ICD-10-CM

## 2019-10-07 NOTE — Progress Notes (Signed)
   Covid-19 Vaccination Clinic  Name:  DEDEE SELLIN    MRN: [id number] DOB: 08-Mar-1961  10/07/2019  Ms. Hodder was observed post Covid-19 immunization for 15 minutes without incident. She was provided with Vaccine Information Sheet and instruction to access the V-Safe system.   Ms. Houge was instructed to call 911 with any severe reactions post vaccine: Marland Kitchen Difficulty breathing  . Swelling of face and throat  . A fast heartbeat  . A bad rash all over body  . Dizziness and weakness   Immunizations Administered    Name Date Dose VIS Date Route   Pfizer COVID-19 Vaccine 10/07/2019  3:25 PM 0.3 mL 07/09/2019 Intramuscular   Manufacturer: [place]   Lot: [id number]   [place]: [id number]

## 2019-11-02 ENCOUNTER — Ambulatory Visit: Payer: Managed Care, Other (non HMO) | Attending: Internal Medicine

## 2019-11-02 DIAGNOSIS — Z23 Encounter for immunization: Secondary | ICD-10-CM

## 2019-11-02 NOTE — Progress Notes (Signed)
   Covid-19 Vaccination Clinic  Name:  Kathy Floyd    MRN: [id number] DOB: 08-21-1960  11/02/2019  [name] was observed post Covid-19 immunization for 15 minutes without incident. She was provided with Vaccine Information Sheet and instruction to access the V-Safe system.   [name] was instructed to call 911 with any severe reactions post vaccine: Marland Kitchen Difficulty breathing  . Swelling of face and throat  . A fast heartbeat  . A bad rash all over body  . Dizziness and weakness   Immunizations Administered    Name Date Dose VIS Date Route   Pfizer COVID-19 Vaccine 11/02/2019  3:30 PM 0.3 mL 07/09/2019 Intramuscular   Manufacturer: [place]   Lot: [id number]   [place]: [id number]

## 2019-11-10 ENCOUNTER — Encounter: Payer: Self-pay | Admitting: Family Medicine

## 2019-11-10 ENCOUNTER — Telehealth (INDEPENDENT_AMBULATORY_CARE_PROVIDER_SITE_OTHER): Payer: Managed Care, Other (non HMO) | Admitting: Family Medicine

## 2019-11-10 VITALS — Wt 155.0 lb

## 2019-11-10 DIAGNOSIS — F419 Anxiety disorder, unspecified: Secondary | ICD-10-CM | POA: Diagnosis not present

## 2019-11-10 MED ORDER — METOPROLOL SUCCINATE ER 100 MG PO TB24: ORAL_TABLET | ORAL | 3 refills | Status: DC

## 2019-11-10 MED ORDER — LORAZEPAM 0.5 MG PO TABS: 0.5000 mg | ORAL_TABLET | Freq: Two times a day (BID) | ORAL | 1 refills | Status: DC | PRN

## 2019-11-10 MED ORDER — DICYCLOMINE HCL 20 MG PO TABS: 20.0000 mg | ORAL_TABLET | ORAL | 5 refills | Status: DC | PRN

## 2019-11-10 NOTE — Progress Notes (Signed)
Virtual Visit via Video Note  I connected with Kathy Floyd  on 11/11/19 at  2:30 PM EDT by a video enabled telemedicine application and verified that I am speaking with the correct person using two identifiers.  Location patient: home Location provider:work or home office Persons participating in the virtual visit: patient, provider  I discussed the limitations of evaluation and management by telemedicine and the availability of in person appointments. The patient expressed understanding and agreed to proceed.   LUZIANA Floyd DOB: [birth year] Encounter date: 11/10/2019  This is a 59 y.o. female who presents with Chief Complaint  Patient presents with  . Follow-up    History of present illness: (couldn't get into mychart - first said that password changed; then tried to change it and had wrong log in - wouldn't send her email so visit was completed by phone).   Needed prescriptions updated and wanted to use virtual method due to COVID.  Has received both pfizer injections. Just mild headache, fatigue.   Has appointment in May to get bloodwork through her company. Gets this every 6 months usually.   Has been seeing Dr. Raliegh Ip since the 80's.   Sees obgyn (Dr. Philis Pique last in September 2020) visit; dermatology (Dr. Veleta Miners 10/04/19). Does wellness program through work and does bloodwork through that once a year.   No longer seeing chiropractor. NP has given her 2 rounds of prednisone for back. Mild daily pain. Bothers more with prolonged standing. 10 min routine 3 x/week along with some stretches from Dr. Owens Shark.   Anxiety - Dad passed in sept from colon cancer.   Good sleeper. Hasn't been an issue.   Has been stable with the toprol and then prn lorazepam/dicyclomine. Used the prn meds more in the last months after dad passing away.    Getting mammogram through obgyn. Up to date per patient.   Allergies  Allergen Reactions  . Sulfa Antibiotics Hives  . Sulfacetamide  Sodium Rash   Current Meds  Medication Sig  . calcium carbonate (CALCIUM 500) 1250 MG tablet Take 1 tablet by mouth once a week.    . Coenzyme Q10 (CO Q 10 PO) Take 1 capsule by mouth 2 (two) times a week.   . dicyclomine (BENTYL) 20 MG tablet Take 1 tablet (20 mg total) by mouth as needed. stress  . diphenhydrAMINE (BENADRYL) 25 MG tablet Take 12.5 mg by mouth at bedtime as needed. sleep   . fish oil-omega-3 fatty acids 1000 MG capsule Take 1 g by mouth once a week.    Marland Kitchen LORazepam (ATIVAN) 0.5 MG tablet Take 1 tablet (0.5 mg total) by mouth 2 (two) times daily as needed.  Marland Kitchen LYSINE PO Take 1 tablet by mouth as needed.   . metoprolol succinate (TOPROL-XL) 100 MG 24 hr tablet TAKE 1 TABLET EVERY DAY  . Multiple Vitamin (MULTIVITAMIN) tablet Take 1 tablet by mouth daily.    Marland Kitchen OVER THE COUNTER MEDICATION CLA 1750mg  daily  . [DISCONTINUED] dicyclomine (BENTYL) 20 MG tablet Take 1 tablet (20 mg total) by mouth as needed. stress  . [DISCONTINUED] LORazepam (ATIVAN) 0.5 MG tablet Take 1 tablet (0.5 mg total) by mouth 2 (two) times daily as needed.  . [DISCONTINUED] metoprolol succinate (TOPROL-XL) 100 MG 24 hr tablet TAKE 1 TABLET EVERY DAY AS  NEEDED FOR ANXIETY (Patient taking differently: TAKE 1 TABLET EVERY DAY)    Review of Systems  Constitutional: Negative for chills, fatigue and fever.  Respiratory: Negative for cough, chest tightness,  shortness of breath and wheezing.   Cardiovascular: Negative for chest pain, palpitations and leg swelling.    Objective:  Wt 155 lb (70.3 kg)   LMP 06/28/2012   BMI 27.03 kg/m   Weight: 155 lb (70.3 kg)   BP Readings from Last 3 Encounters:  07/08/18 110/60  12/24/17 90/70  08/13/16 136/74   Wt Readings from Last 3 Encounters:  11/10/19 155 lb (70.3 kg)  07/08/18 158 lb 4.8 oz (71.8 kg)  12/24/17 151 lb (68.5 kg)    EXAM:  GENERAL: sounds alert, oriented, appears well and in no acute distress  LUNGS: no shortness of breath during  conversation.   PSYCH/NEURO: pleasant and cooperative, no obvious depression or anxiety, speech and thought processing grossly intact   Assessment/Plan  1. Anxiety Stable with current medications. Refilled for her today.  - metoprolol succinate (TOPROL-XL) 100 MG 24 hr tablet; TAKE 1 TABLET EVERY DAY  Dispense: 90 tablet; Refill: 3 - dicyclomine (BENTYL) 20 MG tablet; Take 1 tablet (20 mg total) by mouth as needed. stress  Dispense: 30 tablet; Refill: 5 - LORazepam (ATIVAN) 0.5 MG tablet; Take 1 tablet (0.5 mg total) by mouth 2 (two) times daily as needed.  Dispense: 60 tablet; Refill: 1   I did message Dr. Collene Mares with update in family history (father with diagnosis of colon cancer).   Return for will touch base with her after she sends bloodwork for review.   I discussed the assessment and treatment plan with the patient. The patient was provided an opportunity to ask questions and all were answered. The patient agreed with the plan and demonstrated an understanding of the instructions.   The patient was advised to call back or seek an in-person evaluation if the symptoms worsen or if the condition fails to improve as anticipated.  I provided 20 minutes of non-face-to-face time during this encounter.   Micheline Rough, MD

## 2019-12-07 LAB — VITAMIN D 25 HYDROXY (VIT D DEFICIENCY, FRACTURES): Vit D, 25-Hydroxy: 51.8

## 2019-12-07 LAB — BASIC METABOLIC PANEL
BUN: 21 (ref 4–21)
CO2: 24 — AB (ref 13–22)
Chloride: 101 (ref 99–108)
Creatinine: 1.1 (ref <=1.1)
Glucose: 98
Potassium: 4.5 (ref 3.4–5.3)
Sodium: 138 (ref 137–147)

## 2019-12-07 LAB — HEMOGLOBIN A1C: Hemoglobin A1C: 5.1

## 2019-12-07 LAB — CBC AND DIFFERENTIAL
HCT: 42 (ref 36–46)
Hemoglobin: 13.9 (ref 12.0–16.0)
Neutrophils Absolute: 4
Platelets: 267 (ref 150–399)
WBC: 6.8

## 2019-12-07 LAB — LIPID PANEL
Cholesterol: 176 (ref 0–200)
HDL: 37 (ref 35–70)
LDL Cholesterol: 114
Triglycerides: 137 (ref 40–160)

## 2019-12-07 LAB — HEPATIC FUNCTION PANEL
ALT: 21 (ref 7–35)
AST: 19 (ref 13–35)
Alkaline Phosphatase: 86 (ref 25–125)
Bilirubin, Total: 0.4

## 2019-12-07 LAB — COMPREHENSIVE METABOLIC PANEL
Albumin: 4.6 (ref 3.5–5.0)
Calcium: 9.7 (ref 8.7–10.7)
Globulin: 2.5

## 2019-12-07 LAB — CBC: RBC: 4.39 (ref 3.87–5.11)

## 2019-12-08 ENCOUNTER — Encounter: Payer: Self-pay | Admitting: Family Medicine

## 2019-12-12 NOTE — Telephone Encounter (Signed)
Can you abstract the labs please? Also set up physical in 6 months time. Thanks!

## 2019-12-13 ENCOUNTER — Encounter: Payer: Self-pay | Admitting: Family Medicine

## 2019-12-13 NOTE — Telephone Encounter (Signed)
I called the pt and offered to schedule an appt as below.  Patient stated I am not understanding what she needs as she just had a video visit with Dr Ethlyn Gallery.  I advised the pt this was for anxiety, prior visits were in 2019 and these were not for a physical and again advised of the request as below from the PCP and asked if she wanted to schedule now or call back.  Patient stated she does not want to pay for a physical and will not be scheduling this appt.  Patient asked while she has me on the phone if Dr Ethlyn Gallery needed to put in for a colonoscopy as this was discussed during the video visit.  I advised the pt to contact Dr Collene Mares who performed the last colonoscopy in 2015 as their office will send a letter to each patient letting them know when they are due for another colonoscopy.  Patient stated I was misunderstanding her and I transferred the pt to Janeann Forehand, Engineer, building services.

## 2020-05-11 ENCOUNTER — Other Ambulatory Visit: Payer: Self-pay | Admitting: Family Medicine

## 2020-05-11 DIAGNOSIS — F419 Anxiety disorder, unspecified: Secondary | ICD-10-CM

## 2020-06-14 LAB — HM PAP SMEAR: HM Pap smear: NEGATIVE

## 2020-09-28 LAB — LIPID PANEL
Cholesterol: 230 — AB (ref 0–200)
HDL: 42 (ref 35–70)
LDL Cholesterol: 160
Triglycerides: 156 (ref 40–160)

## 2020-09-28 LAB — HEMOGLOBIN A1C: Hemoglobin A1C: 5.2

## 2020-09-28 LAB — VITAMIN D 25 HYDROXY (VIT D DEFICIENCY, FRACTURES): Vit D, 25-Hydroxy: 46

## 2020-09-29 LAB — HM COLONOSCOPY

## 2020-10-31 ENCOUNTER — Other Ambulatory Visit: Payer: Self-pay

## 2020-11-01 ENCOUNTER — Ambulatory Visit: Payer: Managed Care, Other (non HMO) | Admitting: Family Medicine

## 2020-11-01 ENCOUNTER — Encounter: Payer: Self-pay | Admitting: Family Medicine

## 2020-11-01 VITALS — BP 90/58 | HR 58 | Temp 97.9°F | Ht 63.5 in | Wt 151.3 lb

## 2020-11-01 DIAGNOSIS — Z0000 Encounter for general adult medical examination without abnormal findings: Secondary | ICD-10-CM

## 2020-11-01 DIAGNOSIS — F419 Anxiety disorder, unspecified: Secondary | ICD-10-CM

## 2020-11-01 DIAGNOSIS — G4700 Insomnia, unspecified: Secondary | ICD-10-CM | POA: Diagnosis not present

## 2020-11-01 DIAGNOSIS — Z23 Encounter for immunization: Secondary | ICD-10-CM

## 2020-11-01 MED ORDER — METOPROLOL SUCCINATE ER 100 MG PO TB24: ORAL_TABLET | ORAL | 3 refills | Status: DC

## 2020-11-01 MED ORDER — LORAZEPAM 0.5 MG PO TABS: 0.5000 mg | ORAL_TABLET | Freq: Two times a day (BID) | ORAL | 2 refills | Status: DC | PRN

## 2020-11-01 MED ORDER — DICYCLOMINE HCL 20 MG PO TABS: 20.0000 mg | ORAL_TABLET | ORAL | 5 refills | Status: DC | PRN

## 2020-11-01 NOTE — Progress Notes (Signed)
Kathy Floyd DOB: May 09, 1961 Encounter date: 11/01/2020  This is a 60 y.o. female who presents for complete physical   History of present illness/Additional concerns: Back doing the [place]. She lost 30lb on this before and LDL dropped from 160's to 114. Then gradually adds back in regular foods once she gets to goal. Doing this with husband. Feels that she does better in Summer typically.   Does walk - has a watch to track steps. Has it set for 6,000. Has to walk to reach this. Tends to be better about walking this time of year.   Getting ready to retire, so states there are a lot of moving pieces. Retiring in June 2022.  Has increased omega to 1500 at night; plans to get recheck cholesterol in May - hoping to decrease numbers a little.   Insomnia: benadryl 12.5mg  at bedtime.   IBS: bentyl 20mg  as needed; works well for her.   Anxiety: metoprolol helps with stress levels; tried to half pill but didn't work as well. Has been on full 100mg  for awhile. Rare use of the ativan - takes 2 usually when she needs it. Just at more times of significant stress or when she feels she isn't handling things well.   Just had colonoscopy - Jyothi mann. Went well. She will repeat q 5 years.   Past Medical History:  Diagnosis Date  . Abdominal cramping    stress related per ppt  . Anxiety   . Endometriosis   . Salmonella infection    Past Surgical History:  Procedure Laterality Date  . LAPAROSCOPIC ENDOMETRIOSIS FULGURATION  2014  . NASAL SINUS SURGERY     x2   Allergies  Allergen Reactions  . Sulfa Antibiotics Hives  . Sulfacetamide Sodium Rash   Current Meds  Medication Sig  . Coenzyme Q10 (CO Q 10 PO) Take 1 capsule by mouth 2 (two) times a week.  . diphenhydrAMINE (BENADRYL) 25 MG tablet Take 12.5 mg by mouth at bedtime as needed. sleep  . fish oil-omega-3 fatty acids 1000 MG capsule Take 1,500 mg by mouth daily.  Marland Kitchen LYSINE PO Take 1 tablet by mouth as needed.  . Multiple  Vitamin (MULTIVITAMIN) tablet Take 1 tablet by mouth daily.  Marland Kitchen OVER THE COUNTER MEDICATION CLA 1750mg  daily  . [DISCONTINUED] dicyclomine (BENTYL) 20 MG tablet Take 1 tablet (20 mg total) by mouth as needed. stress  . [DISCONTINUED] LORazepam (ATIVAN) 0.5 MG tablet TAKE 1 TABLET (0.5 MG TOTAL) BY MOUTH 2 (TWO) TIMES DAILY AS NEEDED.  . [DISCONTINUED] metoprolol succinate (TOPROL-XL) 100 MG 24 hr tablet TAKE 1 TABLET EVERY DAY   Social History   Tobacco Use  . Smoking status: Never Smoker  . Smokeless tobacco: Never Used  Substance Use Topics  . Alcohol use: Yes    Comment: occasionally   Family History  Problem Relation Age of Onset  . Leukemia Mother        monitoring  . Heart attack Father 19  . Colon cancer Father 25  . Alcohol abuse Maternal Grandmother   . Heart disease Maternal Grandfather   . Stroke Maternal Grandfather 52  . Other Paternal Grandmother        auto accident  . Heart attack Paternal Grandfather 43  . Hyperlipidemia Paternal Aunt   . Hyperlipidemia Cousin      Review of Systems  Constitutional: Negative for activity change, appetite change, chills, fatigue, fever and unexpected weight change.  HENT: Negative for congestion, ear pain, hearing  loss, sinus pressure, sinus pain, sore throat and trouble swallowing.   Eyes: Negative for pain and visual disturbance.  Respiratory: Negative for cough, chest tightness, shortness of breath and wheezing.   Cardiovascular: Negative for chest pain, palpitations and leg swelling.  Gastrointestinal: Negative for abdominal pain, blood in stool, constipation, diarrhea, nausea and vomiting.  Genitourinary: Negative for difficulty urinating and menstrual problem.  Musculoskeletal: Negative for arthralgias and back pain.  Skin: Negative for rash.  Neurological: Negative for dizziness, weakness, numbness and headaches.  Hematological: Negative for adenopathy. Does not bruise/bleed easily.  Psychiatric/Behavioral: Negative  for sleep disturbance and suicidal ideas. The patient is not nervous/anxious.     CBC:  Lab Results  Component Value Date   WBC 6.8 12/07/2019   WBC 6.3 04/02/2011   HGB 13.9 12/07/2019   HCT 42 12/07/2019   MCH 29.8 04/02/2011   MCHC 33.5 04/02/2011   RDW 13.4 04/02/2011   PLT 267 12/07/2019   CMP: Lab Results  Component Value Date   NA 138 12/07/2019   K 4.5 12/07/2019   CL 101 12/07/2019   CO2 24 (A) 12/07/2019   GLUCOSE 102 (H) 04/02/2011   BUN 21 12/07/2019   CREATININE 1.1 12/07/2019   CREATININE 0.57 04/02/2011   GFRAA >60 04/02/2011   CALCIUM 9.7 12/07/2019   PROT 5.3 (L) 03/31/2011   BILITOT 0.3 03/31/2011   ALKPHOS 86 12/07/2019   ALT 21 12/07/2019   AST 19 12/07/2019   LIPID: Lab Results  Component Value Date   CHOL 230 (A) 09/28/2020   TRIG 156 09/28/2020   HDL 42 09/28/2020   LDLCALC 160 09/28/2020    Objective:  BP (!) 90/58 (BP Location: Left Arm, Patient Position: Sitting, Cuff Size: Normal)   Pulse (!) 58   Temp 97.9 F (36.6 C) (Oral)   Ht 5' 3.5" (1.613 m)   Wt 151 lb 4.8 oz (68.6 kg)   LMP 06/28/2012   BMI 26.38 kg/m   Weight: 151 lb 4.8 oz (68.6 kg)   BP Readings from Last 3 Encounters:  11/01/20 (!) 90/58  07/08/18 110/60  12/24/17 90/70   Wt Readings from Last 3 Encounters:  11/01/20 151 lb 4.8 oz (68.6 kg)  11/10/19 155 lb (70.3 kg)  07/08/18 158 lb 4.8 oz (71.8 kg)    Physical Exam Constitutional:      General: She is not in acute distress.    Appearance: She is well-developed.  HENT:     Head: Normocephalic and atraumatic.     Right Ear: External ear normal.     Left Ear: External ear normal.     Mouth/Throat:     Pharynx: No oropharyngeal exudate.  Eyes:     Conjunctiva/sclera: Conjunctivae normal.     Pupils: Pupils are equal, round, and reactive to light.  Neck:     Thyroid: No thyromegaly.  Cardiovascular:     Rate and Rhythm: Normal rate and regular rhythm.     Heart sounds: Normal heart sounds. No  murmur heard. No friction rub. No gallop.   Pulmonary:     Effort: Pulmonary effort is normal.     Breath sounds: Normal breath sounds.  Abdominal:     General: Bowel sounds are normal. There is no distension.     Palpations: Abdomen is soft. There is no mass.     Tenderness: There is no abdominal tenderness. There is no guarding.     Hernia: No hernia is present.  Musculoskeletal:  General: No tenderness or deformity. Normal range of motion.     Cervical back: Normal range of motion and neck supple.  Lymphadenopathy:     Cervical: No cervical adenopathy.  Skin:    General: Skin is warm and dry.     Findings: No rash.  Neurological:     Mental Status: She is alert and oriented to person, place, and time.     Deep Tendon Reflexes: Reflexes normal.     Reflex Scores:      Tricep reflexes are 2+ on the right side and 2+ on the left side.      Bicep reflexes are 2+ on the right side and 2+ on the left side.      Brachioradialis reflexes are 2+ on the right side and 2+ on the left side.      Patellar reflexes are 2+ on the right side and 2+ on the left side. Psychiatric:        Speech: Speech normal.        Behavior: Behavior normal.        Thought Content: Thought content normal.     Assessment/Plan: There are no preventive care reminders to display for this patient. Health Maintenance reviewed.  1. Preventative health care Discussed importance of regular exercise, healthy eating esp to help with cholesterol numbers. She will let me know lipid numbers when she has these rechecked through work in upcoming months.   Follows regularly with gyn, derm.   2. Anxiety Stable on below medications. Has not tolerated decrease in toprol. Uses ativan/bentyl just prn.  - metoprolol succinate (TOPROL-XL) 100 MG 24 hr tablet; TAKE 1 TABLET EVERY DAY  Dispense: 90 tablet; Refill: 3 - LORazepam (ATIVAN) 0.5 MG tablet; Take 1 tablet (0.5 mg total) by mouth 2 (two) times daily as needed.   Dispense: 60 tablet; Refill: 2 - dicyclomine (BENTYL) 20 MG tablet; Take 1 tablet (20 mg total) by mouth as needed. stress  Dispense: 30 tablet; Refill: 5  3. Need for Tdap vaccination - Tdap vaccine greater than or equal to 7yo IM  4. Insomnia, unspecified type Does well with bentyl prn    Return in about 6 months (around 05/03/2021) for Chronic condition visit.  Micheline Rough, MD

## 2021-07-03 LAB — HM MAMMOGRAPHY

## 2021-10-08 ENCOUNTER — Other Ambulatory Visit: Payer: Self-pay | Admitting: Family Medicine

## 2021-10-08 DIAGNOSIS — F419 Anxiety disorder, unspecified: Secondary | ICD-10-CM

## 2021-12-07 ENCOUNTER — Encounter: Payer: Self-pay | Admitting: Family Medicine

## 2021-12-07 ENCOUNTER — Ambulatory Visit: Payer: Managed Care, Other (non HMO) | Admitting: Family Medicine

## 2021-12-07 VITALS — BP 120/70 | HR 72 | Temp 98.6°F | Ht 63.5 in | Wt 162.4 lb

## 2021-12-07 DIAGNOSIS — Z13220 Encounter for screening for lipoid disorders: Secondary | ICD-10-CM | POA: Diagnosis not present

## 2021-12-07 DIAGNOSIS — R232 Flushing: Secondary | ICD-10-CM | POA: Diagnosis not present

## 2021-12-07 DIAGNOSIS — Z131 Encounter for screening for diabetes mellitus: Secondary | ICD-10-CM

## 2021-12-07 DIAGNOSIS — G4700 Insomnia, unspecified: Secondary | ICD-10-CM | POA: Diagnosis not present

## 2021-12-07 DIAGNOSIS — F419 Anxiety disorder, unspecified: Secondary | ICD-10-CM

## 2021-12-07 DIAGNOSIS — Z0000 Encounter for general adult medical examination without abnormal findings: Secondary | ICD-10-CM

## 2021-12-07 DIAGNOSIS — R739 Hyperglycemia, unspecified: Secondary | ICD-10-CM | POA: Diagnosis not present

## 2021-12-07 LAB — COMPREHENSIVE METABOLIC PANEL
ALT: 20 U/L (ref 0–35)
AST: 19 U/L (ref 0–37)
Albumin: 4.4 g/dL (ref 3.5–5.2)
Alkaline Phosphatase: 63 U/L (ref 39–117)
BUN: 15 mg/dL (ref 6–23)
CO2: 29 mEq/L (ref 19–32)
Calcium: 10 mg/dL (ref 8.4–10.5)
Chloride: 104 mEq/L (ref 96–112)
Creatinine, Ser: 0.87 mg/dL (ref 0.40–1.20)
GFR: 72.37 mL/min (ref >60.00)
Glucose, Bld: 77 mg/dL (ref 70–99)
Potassium: 4.4 mEq/L (ref 3.5–5.1)
Sodium: 141 mEq/L (ref 135–145)
Total Bilirubin: 0.4 mg/dL (ref 0.2–1.2)
Total Protein: 7.1 g/dL (ref 6.0–8.3)

## 2021-12-07 LAB — CBC WITH DIFFERENTIAL/PLATELET
Basophils Absolute: 0 10*3/uL (ref 0.0–0.1)
Basophils Relative: 0.7 % (ref 0.0–3.0)
Eosinophils Absolute: 0.1 10*3/uL (ref 0.0–0.7)
Eosinophils Relative: 1.3 % (ref 0.0–5.0)
HCT: 38.7 % (ref 36.0–46.0)
Hemoglobin: 13 g/dL (ref 12.0–15.0)
Lymphocytes Relative: 34 % (ref 12.0–46.0)
Lymphs Abs: 2.2 10*3/uL (ref 0.7–4.0)
MCHC: 33.5 g/dL (ref 30.0–36.0)
MCV: 92.6 fl (ref 78.0–100.0)
Monocytes Absolute: 0.5 10*3/uL (ref 0.1–1.0)
Monocytes Relative: 7.2 % (ref 3.0–12.0)
Neutro Abs: 3.6 10*3/uL (ref 1.4–7.7)
Neutrophils Relative %: 56.8 % (ref 43.0–77.0)
Platelets: 270 10*3/uL (ref 150.0–400.0)
RBC: 4.18 Mil/uL (ref 3.87–5.11)
RDW: 13.7 % (ref 11.5–15.5)
WBC: 6.4 10*3/uL (ref 4.0–10.5)

## 2021-12-07 LAB — HEMOGLOBIN A1C: Hgb A1c MFr Bld: 5.3 % (ref 4.6–6.5)

## 2021-12-07 LAB — LIPID PANEL
Cholesterol: 214 mg/dL — ABNORMAL HIGH (ref 0–200)
HDL: 42 mg/dL (ref >39.00)
NonHDL: 171.78
Total CHOL/HDL Ratio: 5
Triglycerides: 203 mg/dL — ABNORMAL HIGH (ref 0.0–149.0)
VLDL: 40.6 mg/dL — ABNORMAL HIGH (ref 0.0–40.0)

## 2021-12-07 LAB — TSH: TSH: 1.93 u[IU]/mL (ref 0.35–5.50)

## 2021-12-07 LAB — LDL CHOLESTEROL, DIRECT: Direct LDL: 139 mg/dL

## 2021-12-07 MED ORDER — METOPROLOL SUCCINATE ER 100 MG PO TB24: ORAL_TABLET | ORAL | 1 refills | Status: DC

## 2021-12-07 MED ORDER — DICYCLOMINE HCL 20 MG PO TABS: 20.0000 mg | ORAL_TABLET | ORAL | 1 refills | Status: DC | PRN

## 2021-12-07 MED ORDER — LORAZEPAM 0.5 MG PO TABS: 0.5000 mg | ORAL_TABLET | Freq: Two times a day (BID) | ORAL | 2 refills | Status: DC | PRN

## 2021-12-07 NOTE — Progress Notes (Signed)
Kathy Floyd ?DOB: 1961/02/20 ?Encounter date: 12/07/2021 ? ?This is a 61 y.o. female who presents for complete physical  ? ?History of present illness/Additional concerns: ?Last visit was one year ago. No particular concerns today.  ? ?Anxiety: managing ok.  ?Insomnia: benadryl 12.5 mg at bed time ?HL: fish oil, diet controlled ?Colonoscopy completed last year with Dr. Collene Mares and due for recheck 5 years. ? ?Shingrix: will schedule.  ? ?Deals with right hip/SI pain. Sees ortho. Will take steroid pack about twice yearly in combination with exercises to calm it down. Hard to stand for prolonged periods of time.  ? ?Follows with Dr. Philis Pique for gyn care.  ? ? ?Past Medical History:  ?Diagnosis Date  ? Abdominal cramping   ? stress related per ppt  ? Anxiety   ? Endometriosis   ? Salmonella infection   ? ?Past Surgical History:  ?Procedure Laterality Date  ? LAPAROSCOPIC ENDOMETRIOSIS FULGURATION  2014  ? NASAL SINUS SURGERY    ? x2  ? ?Allergies  ?Allergen Reactions  ? Sulfa Antibiotics Hives  ? Sulfacetamide Sodium Rash  ? ?Current Meds  ?Medication Sig  ? Coenzyme Q10 (CO Q 10 PO) Take 1 capsule by mouth 2 (two) times a week.  ? diphenhydrAMINE (BENADRYL) 25 MG tablet Take 12.5 mg by mouth at bedtime as needed. sleep  ? fish oil-omega-3 fatty acids 1000 MG capsule Take 1,500 mg by mouth daily.  ? LYSINE PO Take 1 tablet by mouth as needed.  ? Multiple Vitamin (MULTIVITAMIN) tablet Take 1 tablet by mouth daily.  ? OVER THE COUNTER MEDICATION CLA '1750mg'$  daily  ? [DISCONTINUED] dicyclomine (BENTYL) 20 MG tablet Take 1 tablet (20 mg total) by mouth as needed. stress  ? [DISCONTINUED] LORazepam (ATIVAN) 0.5 MG tablet Take 1 tablet (0.5 mg total) by mouth 2 (two) times daily as needed.  ? [DISCONTINUED] metoprolol succinate (TOPROL-XL) 100 MG 24 hr tablet TAKE 1 TABLET BY MOUTH EVERY DAY  ? ?Social History  ? ?Tobacco Use  ? Smoking status: Never  ? Smokeless tobacco: Never  ?Substance Use Topics  ? Alcohol use: Yes  ?   Comment: occasionally  ? ?Family History  ?Problem Relation Age of Onset  ? Leukemia Mother   ?     monitoring  ? Heart attack Father 66  ? Colon cancer Father 7  ? Alcohol abuse Maternal Grandmother   ? Heart disease Maternal Grandfather   ? Stroke Maternal Grandfather 75  ? Other Paternal Grandmother   ?     auto accident  ? Heart attack Paternal Grandfather 4  ? Hyperlipidemia Paternal Aunt   ? Hyperlipidemia Cousin   ? ? ? ?Review of Systems  ?Constitutional:  Negative for activity change, appetite change, chills, fatigue, fever and unexpected weight change.  ?HENT:  Negative for congestion, ear pain, hearing loss, sinus pressure, sinus pain, sore throat and trouble swallowing.   ?Eyes:  Negative for pain and visual disturbance.  ?Respiratory:  Negative for cough, chest tightness, shortness of breath and wheezing.   ?Cardiovascular:  Negative for chest pain, palpitations and leg swelling.  ?Gastrointestinal:  Negative for abdominal pain, blood in stool, constipation, diarrhea, nausea and vomiting.  ?Genitourinary:  Negative for difficulty urinating and menstrual problem.  ?Musculoskeletal:  Negative for arthralgias and back pain.  ?Skin:  Negative for rash.  ?Neurological:  Negative for dizziness, weakness, numbness and headaches.  ?Hematological:  Negative for adenopathy. Does not bruise/bleed easily.  ?Psychiatric/Behavioral:  Negative for sleep disturbance and  suicidal ideas. The patient is not nervous/anxious.   ? ?CBC:  ?Lab Results  ?Component Value Date  ? WBC 6.8 12/07/2019  ? WBC 6.3 04/02/2011  ? HGB 13.9 12/07/2019  ? HCT 42 12/07/2019  ? MCH 29.8 04/02/2011  ? MCHC 33.5 04/02/2011  ? RDW 13.4 04/02/2011  ? PLT 267 12/07/2019  ? ?CMP: ?Lab Results  ?Component Value Date  ? NA 138 12/07/2019  ? K 4.5 12/07/2019  ? CL 101 12/07/2019  ? CO2 24 (A) 12/07/2019  ? GLUCOSE 102 (H) 04/02/2011  ? BUN 21 12/07/2019  ? CREATININE 1.1 12/07/2019  ? CREATININE 0.57 04/02/2011  ? GFRAA >60 04/02/2011  ? CALCIUM  9.7 12/07/2019  ? PROT 5.3 (L) 03/31/2011  ? BILITOT 0.3 03/31/2011  ? ALKPHOS 86 12/07/2019  ? ALT 21 12/07/2019  ? AST 19 12/07/2019  ? ?LIPID: ?Lab Results  ?Component Value Date  ? CHOL 230 (A) 09/28/2020  ? TRIG 156 09/28/2020  ? HDL 42 09/28/2020  ? [place] 160 09/28/2020  ? ? ?Objective: ? ?BP 120/70   Pulse 72   Temp 98.6 ?F (37 ?C) (Oral)   Ht 5' 3.5" (1.613 m)   Wt 162 lb 6 oz (73.7 kg)   LMP 06/28/2012   SpO2 98%   BMI 28.31 kg/m?   Weight: 162 lb 6 oz (73.7 kg)  ? ?BP Readings from Last 3 Encounters:  ?12/07/21 120/70  ?11/01/20 (!) 90/58  ?07/08/18 110/60  ? ?Wt Readings from Last 3 Encounters:  ?12/07/21 162 lb 6 oz (73.7 kg)  ?11/01/20 151 lb 4.8 oz (68.6 kg)  ?11/10/19 155 lb (70.3 kg)  ? ? ?Physical Exam ?Constitutional:   ?   General: She is not in acute distress. ?   Appearance: She is well-developed.  ?HENT:  ?   Head: Normocephalic and atraumatic.  ?   Right Ear: External ear normal.  ?   Left Ear: External ear normal.  ?   Mouth/Throat:  ?   Pharynx: No oropharyngeal exudate.  ?Eyes:  ?   Conjunctiva/sclera: Conjunctivae normal.  ?   Pupils: Pupils are equal, round, and reactive to light.  ?Neck:  ?   Thyroid: No thyromegaly.  ?Cardiovascular:  ?   Rate and Rhythm: Normal rate and regular rhythm.  ?   Heart sounds: Normal heart sounds. No murmur heard. ?  No friction rub. No gallop.  ?Pulmonary:  ?   Effort: Pulmonary effort is normal.  ?   Breath sounds: Normal breath sounds.  ?Abdominal:  ?   General: Bowel sounds are normal. There is no distension.  ?   Palpations: Abdomen is soft. There is no mass.  ?   Tenderness: There is no abdominal tenderness. There is no guarding.  ?   Hernia: No hernia is present.  ?Musculoskeletal:     ?   General: No tenderness or deformity. Normal range of motion.  ?   Cervical back: Normal range of motion and neck supple.  ?Lymphadenopathy:  ?   Cervical: No cervical adenopathy.  ?Skin: ?   General: Skin is warm and dry.  ?   Findings: No rash.   ?Neurological:  ?   Mental Status: She is alert and oriented to person, place, and time.  ?   Deep Tendon Reflexes: Reflexes normal.  ?   Reflex Scores: ?     Tricep reflexes are 2+ on the right side and 2+ on the left side. ?     Bicep reflexes  are 2+ on the right side and 2+ on the left side. ?     Brachioradialis reflexes are 2+ on the right side and 2+ on the left side. ?     Patellar reflexes are 2+ on the right side and 2+ on the left side. ?Psychiatric:     ?   Speech: Speech normal.     ?   Behavior: Behavior normal.     ?   Thought Content: Thought content normal.  ? ? ?Assessment/Plan: ?Health Maintenance Due  ?Topic Date Due  ? HIV Screening  Never done  ? Hepatitis C Screening  Never done  ? Zoster Vaccines- Shingrix (1 of 2) Never done  ? MAMMOGRAM  11/01/2021  ? ?Health Maintenance reviewed -encouraged her to get shingrix, but she will return to the office for this as she is heading on vacation and wants to feel well for that. ? ? ? ?1. Preventative health care ?Keep up with regular exercise, activity.  ? ?2. Lipid screening ?- Lipid panel; Future ?- Lipid panel ? ?3. Screening for diabetes mellitus ? ? ?4. Insomnia, unspecified type ?She does well with low dose benadryl if needed. ? ?5. Anxiety ?Occasional use of ativan, toprol,bentyl with episodes of anxiety. She is generally well controlled. ?- LORazepam (ATIVAN) 0.5 MG tablet; Take 1 tablet (0.5 mg total) by mouth 2 (two) times daily as needed.  Dispense: 60 tablet; Refill: 2 ? ?- metoprolol succinate (TOPROL-XL) 100 MG 24 hr tablet; TAKE 1 TABLET BY MOUTH EVERY DAY  Dispense: 90 tablet; Refill: 1 ? ?6. Hyperglycemia ?- Comprehensive metabolic panel; Future ?- Hemoglobin A1c; Future ?- Hemoglobin A1c ?- Comprehensive metabolic panel ? ?7. Hot flashes ?- CBC with Differential/Platelet; Future ?- Comprehensive metabolic panel; Future ?- TSH; Future ?- TSH ?- Comprehensive metabolic panel ?- CBC with Differential/Platelet ? ? ?Return for nurse visit  for shingrix vaccination. ? ?Micheline Rough, MD ? ? ? ? ? ?

## 2021-12-31 ENCOUNTER — Ambulatory Visit (INDEPENDENT_AMBULATORY_CARE_PROVIDER_SITE_OTHER): Payer: Managed Care, Other (non HMO)

## 2021-12-31 DIAGNOSIS — Z23 Encounter for immunization: Secondary | ICD-10-CM

## 2022-01-22 ENCOUNTER — Encounter: Payer: Self-pay | Admitting: Family

## 2022-03-04 ENCOUNTER — Ambulatory Visit (INDEPENDENT_AMBULATORY_CARE_PROVIDER_SITE_OTHER): Payer: Managed Care, Other (non HMO)

## 2022-03-04 DIAGNOSIS — Z23 Encounter for immunization: Secondary | ICD-10-CM

## 2022-04-04 ENCOUNTER — Ambulatory Visit: Payer: Managed Care, Other (non HMO)

## 2022-04-16 ENCOUNTER — Ambulatory Visit: Payer: Managed Care, Other (non HMO) | Admitting: Family Medicine

## 2022-04-16 ENCOUNTER — Encounter: Payer: Self-pay | Admitting: Family Medicine

## 2022-04-16 VITALS — BP 100/58 | HR 62 | Temp 97.8°F | Ht 63.5 in | Wt 169.3 lb

## 2022-04-16 DIAGNOSIS — F411 Generalized anxiety disorder: Secondary | ICD-10-CM

## 2022-04-16 NOTE — Progress Notes (Signed)
Established Patient Office Visit  Subjective   Patient ID: Kathy Floyd, female    DOB: 04-Jan-1961  Age: 61 y.o. MRN: [id number]  Chief Complaint  Patient presents with   Establish Care    Patient is here for transition of care visit.   Anxiety-- patient reports she is only taking the lorazepam 5-6 times per month. States that she takes it in the morning. States that the metoprolol is what she is taking on a daily basis to help manage her anxiety symptoms. States that she has been on this medication for a long time and it is working well for her.   Joint pain- pt reports she has seen Dr. Doren Custard in the past for chronic back pain and sciatica. States that since she has gotten older she has been feeling occasional aches and pain, more so in her shoulders and ankles. States that it is at a mild level.    Current Outpatient Medications  Medication Instructions   CALCIUM PO Oral, Daily at bedtime   dicyclomine (BENTYL) 20 mg, Oral, As needed, stress   diphenhydrAMINE (BENADRYL) 12.5 mg, Oral, At bedtime PRN, sleep    fish oil-omega-3 fatty acids 1,500 mg, Oral, Daily,     LORazepam (ATIVAN) 0.5 mg, Oral, 2 times daily PRN   LYSINE PO 1 tablet, Oral, As needed   metoprolol succinate (TOPROL-XL) 100 MG 24 hr tablet TAKE 1 TABLET BY MOUTH EVERY DAY   Multiple Vitamin (MULTIVITAMIN) tablet 1 tablet, Oral, Daily,     OVER THE COUNTER MEDICATION CLA '1750mg'$  daily     Patient Active Problem List   Diagnosis Date Noted   HIP PAIN, RIGHT, CHRONIC 06/06/2010   Anxiety state 04/27/2008      Review of Systems  All other systems reviewed and are negative.     Objective:     BP (!) 100/58 (BP Location: Left Arm, Patient Position: Sitting, Cuff Size: Normal)   Pulse 62   Temp 97.8 F (36.6 C) (Oral)   Ht 5' 3.5" (1.613 m)   Wt 169 lb 4.8 oz (76.8 kg)   LMP 06/28/2012   SpO2 98%   BMI 29.52 kg/m     Physical Exam Constitutional:      Appearance: Normal appearance. She is  normal weight.  HENT:     Head: Normocephalic and atraumatic.  Eyes:     Conjunctiva/sclera: Conjunctivae normal.  Neck:     Thyroid: No thyromegaly.  Cardiovascular:     Rate and Rhythm: Normal rate and regular rhythm.     Pulses: Normal pulses.     Heart sounds: No murmur heard. Pulmonary:     Effort: Pulmonary effort is normal.     Breath sounds: Normal breath sounds. No wheezing.  Abdominal:     General: Bowel sounds are normal.  Neurological:     Mental Status: She is alert and oriented to person, place, and time. Mental status is at baseline.  Psychiatric:        Mood and Affect: Mood normal.        Behavior: Behavior normal.    No results found for any visits on 04/16/22.  Last metabolic panel Lab Results  Component Value Date   GLUCOSE 77 12/07/2021   NA 141 12/07/2021   K 4.4 12/07/2021   CL 104 12/07/2021   CO2 29 12/07/2021   BUN 15 12/07/2021   CREATININE 0.87 12/07/2021   CALCIUM 10.0 12/07/2021   PROT 7.1 12/07/2021   ALBUMIN 4.4 12/07/2021  BILITOT 0.4 12/07/2021   ALKPHOS 63 12/07/2021   AST 19 12/07/2021   ALT 20 12/07/2021   Last lipids Lab Results  Component Value Date   CHOL 214 (H) 12/07/2021   HDL 42.00 12/07/2021   LDLCALC 160 09/28/2020   LDLDIRECT 139.0 12/07/2021   TRIG 203.0 (H) 12/07/2021   CHOLHDL 5 12/07/2021      The 10-year ASCVD risk score (Arnett DK, et al., 2019) is: 2.6%    Assessment & Plan:   Problem List Items Addressed This Visit       Other   Anxiety state - Primary    On metoprolol 100 mg daily, only taking the lorazepam about 5-6 times per month. We did discuss the high risk nature of the lorazepam and I advised her to continue using it sparingly. She has plenty of this medication and refills at home, will see her back in May 2024 for her annual physical.       Return in about 8 months (around 12/15/2022) for Annual Physical Exam.    Farrel Conners, MD

## 2022-04-16 NOTE — Assessment & Plan Note (Addendum)
On metoprolol 100 mg daily, only taking the lorazepam about 5-6 times per month. We did discuss the high risk nature of the lorazepam and I advised her to continue using it sparingly. She has plenty of this medication and refills at home, will see her back in May 2024 for her annual physical.

## 2022-06-17 ENCOUNTER — Telehealth: Payer: Self-pay | Admitting: Family Medicine

## 2022-06-17 DIAGNOSIS — F419 Anxiety disorder, unspecified: Secondary | ICD-10-CM

## 2022-06-17 NOTE — Telephone Encounter (Signed)
Pt is calling and need a refill on metoprolol succinate (TOPROL-XL) 100 MG 24 hr tablet   [id number]- [place], [place] - [address] Phone: [phone] Fax: [phone]

## 2022-06-18 MED ORDER — METOPROLOL SUCCINATE ER 100 MG PO TB24: ORAL_TABLET | ORAL | 1 refills | Status: DC

## 2022-06-18 NOTE — Telephone Encounter (Signed)
Ok to refill 

## 2022-06-18 NOTE — Telephone Encounter (Signed)
Rx done. 

## 2022-07-17 LAB — HM MAMMOGRAPHY

## 2022-08-20 ENCOUNTER — Ambulatory Visit (HOSPITAL_BASED_OUTPATIENT_CLINIC_OR_DEPARTMENT_OTHER)
Admission: RE | Admit: 2022-08-20 | Discharge: 2022-08-20 | Disposition: A | Discharge status: Home / Self Care | Payer: BC Managed Care – PPO | Source: Ambulatory Visit | Attending: Family Medicine | Admitting: Family Medicine

## 2022-08-20 ENCOUNTER — Ambulatory Visit (INDEPENDENT_AMBULATORY_CARE_PROVIDER_SITE_OTHER): Payer: BC Managed Care – PPO | Admitting: Family Medicine

## 2022-08-20 ENCOUNTER — Ambulatory Visit (HOSPITAL_BASED_OUTPATIENT_CLINIC_OR_DEPARTMENT_OTHER): Payer: BC Managed Care – PPO

## 2022-08-20 ENCOUNTER — Encounter: Payer: Self-pay | Admitting: Family Medicine

## 2022-08-20 VITALS — BP 100/58 | HR 56 | Temp 98.6°F | Ht 63.5 in | Wt 176.0 lb

## 2022-08-20 DIAGNOSIS — J209 Acute bronchitis, unspecified: Secondary | ICD-10-CM | POA: Diagnosis not present

## 2022-08-20 MED ORDER — BENZONATATE 100 MG PO CAPS: 100.0000 mg | ORAL_CAPSULE | Freq: Two times a day (BID) | ORAL | 0 refills | Status: DC | PRN

## 2022-08-20 MED ORDER — METHYLPREDNISOLONE 4 MG PO TBPK: ORAL_TABLET | ORAL | 0 refills | Status: DC

## 2022-08-20 NOTE — Progress Notes (Signed)
Acute Office Visit  Subjective:     Patient ID: Kathy Floyd, female    DOB: 1961/01/26, 62 y.o.   MRN: [id number]  Chief Complaint  Patient presents with  . Cough    Productive with green sputum x2-3 weeks, tried Nyquil with no relief  . Anemia    Cough  Anemia  Patient is in today for coughing with mucus production. States she had her family over during christmas break and thought she had caught something like a URI then, however she reports that her nasal congestion and her "head symptoms" resolved, however her cough has persisted, states that she is also starting to feel increased shortness of breath, states that this is more recent, states that she walked a flight of stairs and felt SOB afterwards which has never happened to her before, states her chest feels more tight, no pain. No fever/chills.   Review of Systems  Respiratory:  Positive for cough.   All other systems reviewed and are negative.       Objective:    BP (!) 100/58 (BP Location: Left Arm, Patient Position: Sitting, Cuff Size: Large)   Pulse (!) 56   Temp 98.6 F (37 C) (Oral)   Ht 5' 3.5" (1.613 m)   Wt 176 lb (79.8 kg)   LMP 06/28/2012   SpO2 98%   BMI 30.69 kg/m  {Vitals History (Optional):23777}  Physical Exam Vitals reviewed.  Constitutional:      Appearance: Normal appearance. She is well-groomed and normal weight.  HENT:     Right Ear: Tympanic membrane normal.     Left Ear: Tympanic membrane normal.  Eyes:     Conjunctiva/sclera: Conjunctivae normal.  Cardiovascular:     Rate and Rhythm: Normal rate and regular rhythm.     Pulses: Normal pulses.     Heart sounds: S1 normal and S2 normal.  Pulmonary:     Effort: Pulmonary effort is normal.     Breath sounds: Normal breath sounds and air entry.  Abdominal:     General: Bowel sounds are normal.  Musculoskeletal:     Right lower leg: No edema.     Left lower leg: No edema.  Neurological:     Mental Status: She is alert and  oriented to person, place, and time. Mental status is at baseline.     Gait: Gait is intact.  Psychiatric:        Mood and Affect: Mood and affect normal.        Speech: Speech normal.        Behavior: Behavior normal.        Judgment: Judgment normal.    No results found for any visits on 08/20/22.      Assessment & Plan:   Problem List Items Addressed This Visit   None Visit Diagnoses     Acute bronchitis, unspecified organism    -  Primary   Relevant Medications   methylPREDNISolone (MEDROL DOSEPAK) 4 MG TBPK tablet   benzonatate (TESSALON) 100 MG capsule   Other Relevant Orders   DG Chest 2 View       Meds ordered this encounter  Medications  . methylPREDNISolone (MEDROL DOSEPAK) 4 MG TBPK tablet    Sig: Take package as directed.    Dispense:  21 each    Refill:  0  . benzonatate (TESSALON) 100 MG capsule    Sig: Take 1 capsule (100 mg total) by mouth 2 (two) times daily as needed for cough.  Dispense:  20 capsule    Refill:  0    No follow-ups on file.  Farrel Conners, MD

## 2022-08-22 ENCOUNTER — Telehealth: Payer: Self-pay | Admitting: Family Medicine

## 2022-08-22 NOTE — Progress Notes (Signed)
CXR clear, no signs of pneumonia

## 2022-08-22 NOTE — Telephone Encounter (Signed)
Chest x ray has not been read yet.

## 2022-09-03 ENCOUNTER — Telehealth: Payer: Self-pay | Admitting: Family Medicine

## 2022-09-03 NOTE — Telephone Encounter (Addendum)
Pt was last seen on 08/20/22  FYI:  Pt wanted to inform MD: Still having a hard time breathing Unable to take deep breaths Winded taking stairs, or doing simple chores  Pt was offered an OV, but declined, stating she just wanted some advice.  Please advise.

## 2022-09-03 NOTE — Telephone Encounter (Signed)
I called the patient and informed her she needs to be seen for evaluation.  Offered an appt today at 3pm with Dr Elease Hashimoto as PCP does not have any openings. Patient stated she does not have time to come in today and I advised she seek treatment in the ER or an urgent care especially due to the symptoms she is having and offered to inform her of locations close to her home.  Patient declined and an appt was scheduled with Dr Elease Hashimoto tomorrow at [facility].  Patient was advised to go to the ER or urgent care if symptoms worsen prior to the appt on 2/7.

## 2022-09-04 ENCOUNTER — Ambulatory Visit (INDEPENDENT_AMBULATORY_CARE_PROVIDER_SITE_OTHER): Payer: BC Managed Care – PPO | Admitting: Family Medicine

## 2022-09-04 ENCOUNTER — Encounter: Payer: Self-pay | Admitting: Family Medicine

## 2022-09-04 VITALS — BP 118/66 | HR 66 | Temp 98.5°F | Ht 63.5 in | Wt 176.9 lb

## 2022-09-04 DIAGNOSIS — R0600 Dyspnea, unspecified: Secondary | ICD-10-CM

## 2022-09-04 DIAGNOSIS — R059 Cough, unspecified: Secondary | ICD-10-CM | POA: Diagnosis not present

## 2022-09-04 MED ORDER — ALBUTEROL SULFATE HFA 108 (90 BASE) MCG/ACT IN AERS: 2.0000 | INHALATION_SPRAY | RESPIRATORY_TRACT | 0 refills | Status: DC | PRN

## 2022-09-04 MED ORDER — PREDNISONE 20 MG PO TABS: ORAL_TABLET | ORAL | 0 refills | Status: DC

## 2022-09-04 NOTE — Patient Instructions (Signed)
Please let me know if cough not improved in one week.

## 2022-09-04 NOTE — Progress Notes (Signed)
Established Patient Office Visit  Subjective   Patient ID: Kathy Floyd, female    DOB: 1961/03/17  Age: 62 y.o. MRN: [id number]  Chief Complaint  Patient presents with   Breathing Problem    Patient complains of difficulty breathing, x2 weeks   Shortness of Breath    HPI   Kathy Floyd is seen with some persistent cough but mostly dyspnea specially late day with activities.  She had respiratory illness back around December but never had any fever.  She had some productive cough initially and now mostly dry.  She had chest x-ray when she was seen here on 23 January which showed no pneumonia.  She was treated with course of prednisone and may have had some mild improvement afterwards.  There was wheezing noted on exam at that time.  No pleuritic pain.  No chest pain whatsoever.  Non-smoker.  No peripheral edema.  Does not feel particularly ill overall.  Appetite and weight stable.  No history of asthma.  Past Medical History:  Diagnosis Date   Abdominal cramping    stress related per ppt   Anxiety    Endometriosis    Salmonella infection    Past Surgical History:  Procedure Laterality Date   LAPAROSCOPIC ENDOMETRIOSIS FULGURATION  2014   NASAL SINUS SURGERY     x2    reports that she has never smoked. She has never used smokeless tobacco. She reports current alcohol use. She reports that she does not use drugs. family history includes Alcohol abuse in her maternal grandmother; Colon cancer (age of onset: 57) in her father; Heart attack (age of onset: 44) in her father; Heart attack (age of onset: 29) in her paternal grandfather; Heart disease in her maternal grandfather; Hyperlipidemia in her cousin and paternal aunt; Leukemia in her mother; Other in her paternal grandmother; Stroke (age of onset: 47) in her maternal grandfather. Allergies  Allergen Reactions   Sulfa Antibiotics Hives   Sulfacetamide Sodium Rash    Review of Systems  Constitutional:  Negative for chills, fever,  malaise/fatigue and weight loss.  Respiratory:  Positive for cough and shortness of breath. Negative for hemoptysis and sputum production.   Cardiovascular:  Negative for chest pain, palpitations and leg swelling.      Objective:     BP 118/66 (BP Location: Left Arm, Patient Position: Sitting, Cuff Size: Normal)   Pulse 66   Temp 98.5 F (36.9 C) (Oral)   Ht 5' 3.5" (1.613 m)   Wt 176 lb 14.4 oz (80.2 kg)   LMP 06/28/2012   SpO2 95%   BMI 30.84 kg/m  BP Readings from Last 3 Encounters:  09/04/22 118/66  08/20/22 (!) 100/58  04/16/22 (!) 100/58   Wt Readings from Last 3 Encounters:  09/04/22 176 lb 14.4 oz (80.2 kg)  08/20/22 176 lb (79.8 kg)  04/16/22 169 lb 4.8 oz (76.8 kg)      Physical Exam Vitals reviewed.  Constitutional:      General: She is not in acute distress. Cardiovascular:     Rate and Rhythm: Normal rate and regular rhythm.     Heart sounds: No murmur heard. Pulmonary:     Comments: Pulse oximetry 95% room air.  No increased work of breathing.  No retractions.  Symmetric breath sounds.  A few faint expiratory wheezes.  No rales. Neurological:     Mental Status: She is alert.      No results found for any visits on 09/04/22.  The 10-year ASCVD risk score (Arnett DK, et al., 2019) is: 3.9%    Assessment & Plan:   Patient relates several week history of some mild dyspnea with exertion without chest pain.  Recent respiratory illness.  Recent chest x-ray unremarkable.  Still has some evidence for mild reactive airway component.  No history of asthma.  Low clinical suspicion for pulmonary emboli, or cardiac source  -Recommend second course of prednisone 20 mg 2 tablets once daily for 5 days -Trial of albuterol MDI 2 puffs every 6 hours as needed for cough and wheeze -Be in touch if shortness of breath and residual cough not resolving over the next week -Consider further evaluation at that time with possible echo and/or pulmonary function test if  not improving -Also consider home pulse oximetry for monitoring of O2 sats at rest and with activity   Carolann Littler, MD

## 2022-11-06 ENCOUNTER — Other Ambulatory Visit: Payer: Self-pay | Admitting: Family Medicine

## 2022-11-06 DIAGNOSIS — F419 Anxiety disorder, unspecified: Secondary | ICD-10-CM

## 2022-12-10 ENCOUNTER — Telehealth: Payer: Self-pay | Admitting: Family Medicine

## 2022-12-10 DIAGNOSIS — F419 Anxiety disorder, unspecified: Secondary | ICD-10-CM

## 2022-12-10 MED ORDER — LORAZEPAM 0.5 MG PO TABS: 0.5000 mg | ORAL_TABLET | Freq: Two times a day (BID) | ORAL | 0 refills | Status: DC | PRN

## 2022-12-10 MED ORDER — DICYCLOMINE HCL 20 MG PO TABS: 20.0000 mg | ORAL_TABLET | Freq: Three times a day (TID) | ORAL | 1 refills | Status: DC | PRN

## 2022-12-10 MED ORDER — METOPROLOL SUCCINATE ER 100 MG PO TB24: 100.0000 mg | ORAL_TABLET | Freq: Every day | ORAL | 1 refills | Status: DC

## 2022-12-10 NOTE — Telephone Encounter (Signed)
Scripts sent

## 2022-12-10 NOTE — Telephone Encounter (Signed)
Prescription Request  12/10/2022  LOV: 08/20/2022  What is the name of the medication or equipment? dicyclomine (BENTYL) 20 MG tablet, metoprolol succinate (TOPROL-XL) 100 MG 24 hr tablet ,LORazepam (ATIVAN) 0.5 MG tablet   Have you contacted your pharmacy to request a refill? No   Which pharmacy would you like this sent to?  [id number] - [place], [place] - [address] [place] [address] Phone: [phone] Fax: [phone]    Patient notified that their request is being sent to the clinical staff for review and that they should receive a response within 2 business days.   Please advise at Mobile [phone] (mobile)

## 2023-01-01 ENCOUNTER — Other Ambulatory Visit: Payer: Self-pay | Admitting: Family Medicine

## 2023-01-01 DIAGNOSIS — F419 Anxiety disorder, unspecified: Secondary | ICD-10-CM

## 2023-08-04 LAB — HM MAMMOGRAPHY

## 2023-09-08 ENCOUNTER — Encounter: Payer: Self-pay | Admitting: Family Medicine

## 2023-09-08 ENCOUNTER — Ambulatory Visit (INDEPENDENT_AMBULATORY_CARE_PROVIDER_SITE_OTHER): Payer: BC Managed Care – PPO | Admitting: Family Medicine

## 2023-09-08 VITALS — BP 132/78 | HR 70 | Temp 97.9°F | Ht 64.25 in | Wt 177.7 lb

## 2023-09-08 DIAGNOSIS — Z0000 Encounter for general adult medical examination without abnormal findings: Secondary | ICD-10-CM

## 2023-09-08 DIAGNOSIS — Z13220 Encounter for screening for lipoid disorders: Secondary | ICD-10-CM

## 2023-09-08 DIAGNOSIS — F419 Anxiety disorder, unspecified: Secondary | ICD-10-CM

## 2023-09-08 DIAGNOSIS — F411 Generalized anxiety disorder: Secondary | ICD-10-CM

## 2023-09-08 LAB — COMPREHENSIVE METABOLIC PANEL
ALT: 33 U/L (ref 0–35)
AST: 23 U/L (ref 0–37)
Albumin: 4.4 g/dL (ref 3.5–5.2)
Alkaline Phosphatase: 68 U/L (ref 39–117)
BUN: 15 mg/dL (ref 6–23)
CO2: 29 meq/L (ref 19–32)
Calcium: 9.4 mg/dL (ref 8.4–10.5)
Chloride: 103 meq/L (ref 96–112)
Creatinine, Ser: 0.81 mg/dL (ref 0.40–1.20)
GFR: 77.89 mL/min (ref >60.00)
Glucose, Bld: 91 mg/dL (ref 70–99)
Potassium: 4.2 meq/L (ref 3.5–5.1)
Sodium: 140 meq/L (ref 135–145)
Total Bilirubin: 0.4 mg/dL (ref 0.2–1.2)
Total Protein: 7.2 g/dL (ref 6.0–8.3)

## 2023-09-08 LAB — LIPID PANEL
Cholesterol: 202 mg/dL — ABNORMAL HIGH (ref 0–200)
HDL: 40.2 mg/dL (ref >39.00)
LDL Cholesterol: 108 mg/dL — ABNORMAL HIGH (ref 0–99)
NonHDL: 161.66
Total CHOL/HDL Ratio: 5
Triglycerides: 266 mg/dL — ABNORMAL HIGH (ref 0.0–149.0)
VLDL: 53.2 mg/dL — ABNORMAL HIGH (ref 0.0–40.0)

## 2023-09-08 LAB — CBC WITH DIFFERENTIAL/PLATELET
Basophils Absolute: 0.1 10*3/uL (ref 0.0–0.1)
Basophils Relative: 0.8 % (ref 0.0–3.0)
Eosinophils Absolute: 0.1 10*3/uL (ref 0.0–0.7)
Eosinophils Relative: 1.7 % (ref 0.0–5.0)
HCT: 38.2 % (ref 36.0–46.0)
Hemoglobin: 12.9 g/dL (ref 12.0–15.0)
Lymphocytes Relative: 37.2 % (ref 12.0–46.0)
Lymphs Abs: 2.8 10*3/uL (ref 0.7–4.0)
MCHC: 33.7 g/dL (ref 30.0–36.0)
MCV: 92.9 fL (ref 78.0–100.0)
Monocytes Absolute: 0.5 10*3/uL (ref 0.1–1.0)
Monocytes Relative: 6.6 % (ref 3.0–12.0)
Neutro Abs: 4 10*3/uL (ref 1.4–7.7)
Neutrophils Relative %: 53.7 % (ref 43.0–77.0)
Platelets: 312 10*3/uL (ref 150.0–400.0)
RBC: 4.11 Mil/uL (ref 3.87–5.11)
RDW: 13.3 % (ref 11.5–15.5)
WBC: 7.5 10*3/uL (ref 4.0–10.5)

## 2023-09-08 MED ORDER — METOPROLOL SUCCINATE ER 100 MG PO TB24: 100.0000 mg | ORAL_TABLET | Freq: Every day | ORAL | 1 refills | Status: DC

## 2023-09-08 MED ORDER — DICYCLOMINE HCL 20 MG PO TABS
20.0000 mg | ORAL_TABLET | Freq: Three times a day (TID) | ORAL | 1 refills | Status: AC | PRN
Start: 2023-09-08 — End: ?

## 2023-09-08 MED ORDER — LORAZEPAM 0.5 MG PO TABS: 0.5000 mg | ORAL_TABLET | Freq: Two times a day (BID) | ORAL | 0 refills | Status: DC | PRN

## 2023-09-08 NOTE — Patient Instructions (Signed)

## 2023-09-08 NOTE — Progress Notes (Signed)
 Complete physical exam  Patient: Kathy Floyd   DOB: 1961/02/16   63 y.o. Female  MRN: [id number]  Subjective:    Chief Complaint  Patient presents with   Annual Exam    Kathy Floyd is a 63 y.o. female who presents today for a complete physical exam. She reports consuming a general diet. Home exercise routine includes walking 1.5 hrs per week. She generally feels well. She reports sleeping fairly well. She does not have additional problems to discuss today.   Anxiety-- pt is requesting a refill of the lorazepam. States she had a difficult summer -- started seeing a therapist and is feeling some better today. She reports she is taking it about once a week. We discussed other medications that can be used to help on a daily basis that are not habit forming and have less risk.   Most recent fall risk assessment:    12/07/2021   10:11 AM  Fall Risk   Falls in the past year? 0  Number falls in past yr: 0  Injury with Fall? 0  Risk for fall due to : No Fall Risks  Follow up Falls evaluation completed     Most recent depression screenings:    09/08/2023    1:57 PM 09/04/2022    7:48 AM  PHQ 2/9 Scores  PHQ - 2 Score 0 0    Vision:Within last year and Dental: No current dental problems and Receives regular dental care  Patient Active Problem List   Diagnosis Date Noted   HIP PAIN, RIGHT, CHRONIC 06/06/2010   Anxiety state 04/27/2008      Patient Care Team: Karie Georges, MD as PCP - General (Family Medicine) Carrington Clamp, MD as Consulting Physician (Obstetrics and Gynecology) [facility], [facility]   Outpatient Medications Prior to Visit  Medication Sig   Ascorbic Acid (VITAMIN C PO) Take 500 mg by mouth daily.   diphenhydrAMINE (BENADRYL) 25 MG tablet Take 12.5 mg by mouth at bedtime as needed. sleep   fish oil-omega-3 fatty acids 1000 MG capsule Take 1,500 mg by mouth daily.   LYSINE PO Take 1 tablet by mouth as needed.   Multiple Vitamin  (MULTIVITAMIN) tablet Take 1 tablet by mouth daily.   [DISCONTINUED] albuterol (VENTOLIN HFA) 108 (90 Base) MCG/ACT inhaler Inhale 2 puffs into the lungs every 4 (four) hours as needed for wheezing or shortness of breath.   [DISCONTINUED] benzonatate (TESSALON) 100 MG capsule Take 1 capsule (100 mg total) by mouth 2 (two) times daily as needed for cough.   [DISCONTINUED] BLACK COHOSH PO Take by mouth.   [DISCONTINUED] dicyclomine (BENTYL) 20 MG tablet TAKE 1 TABLET (20 MG TOTAL) BY MOUTH 3 (THREE) TIMES DAILY AS NEEDED. STRESS   [DISCONTINUED] LORazepam (ATIVAN) 0.5 MG tablet Take 1 tablet (0.5 mg total) by mouth 2 (two) times daily as needed.   [DISCONTINUED] methylPREDNISolone (MEDROL DOSEPAK) 4 MG TBPK tablet Take package as directed.   [DISCONTINUED] metoprolol succinate (TOPROL-XL) 100 MG 24 hr tablet Take 1 tablet (100 mg total) by mouth daily. Take with or immediately following a meal.   [DISCONTINUED] OVER THE COUNTER MEDICATION CLA 1750mg  daily   [DISCONTINUED] predniSONE (DELTASONE) 20 MG tablet Take two tablets by mouth once daily for 5 days.   [DISCONTINUED] CALCIUM PO Take by mouth at bedtime.   No facility-administered medications prior to visit.    Review of Systems  HENT:  Negative for hearing loss.   Eyes:  Negative for blurred vision.  Respiratory:  Negative for shortness of breath.   Cardiovascular:  Negative for chest pain.  Gastrointestinal: Negative.   Genitourinary: Negative.   Musculoskeletal:  Negative for back pain.  Neurological:  Negative for headaches.  Psychiatric/Behavioral:  Negative for depression.   All other systems reviewed and are negative.      Objective:     BP 132/78   Pulse 70   Temp 97.9 F (36.6 C) (Oral)   Ht 5' 4.25" (1.632 m)   Wt 177 lb 11.2 oz (80.6 kg)   LMP 06/28/2012   SpO2 97%   BMI 30.27 kg/m    Physical Exam Vitals reviewed.  Constitutional:      Appearance: Normal appearance. She is well-groomed. She is obese.   HENT:     Right Ear: Tympanic membrane and ear canal normal.     Left Ear: Tympanic membrane and ear canal normal.     Mouth/Throat:     Mouth: Mucous membranes are moist.     Pharynx: No posterior oropharyngeal erythema.  Eyes:     Conjunctiva/sclera: Conjunctivae normal.  Neck:     Thyroid: No thyromegaly.  Cardiovascular:     Rate and Rhythm: Normal rate and regular rhythm.     Pulses: Normal pulses.     Heart sounds: S1 normal and S2 normal.  Pulmonary:     Effort: Pulmonary effort is normal.     Breath sounds: Normal breath sounds and air entry.  Abdominal:     General: Abdomen is flat. Bowel sounds are normal.     Palpations: Abdomen is soft.  Musculoskeletal:     Right lower leg: No edema.     Left lower leg: No edema.  Lymphadenopathy:     Cervical: No cervical adenopathy.  Neurological:     Mental Status: She is alert and oriented to person, place, and time. Mental status is at baseline.     Gait: Gait is intact.  Psychiatric:        Mood and Affect: Mood and affect normal.        Speech: Speech normal.        Behavior: Behavior normal.        Judgment: Judgment normal.         Assessment & Plan:    Routine Health Maintenance and Physical Exam  Immunization History  Administered Date(s) Administered   PFIZER(Purple Top)SARS-COV-2 Vaccination 10/07/2019, 11/02/2019, 08/29/2020   Td 03/30/2010   Tdap 11/01/2020   Zoster Recombinant(Shingrix) 12/31/2021, 03/04/2022    Health Maintenance  Topic Date Due   COVID-19 Vaccine (4 - 2024-25 season) 03/30/2023   Cervical Cancer Screening (HPV/Pap Cotest)  06/15/2023   MAMMOGRAM  07/18/2023   INFLUENZA VACCINE  10/27/2023 (Originally 02/27/2023)   Hepatitis C Screening  09/07/2024 (Originally 06/04/1979)   HIV Screening  09/07/2024 (Originally 06/03/1976)   Colonoscopy  09/30/2030   DTaP/Tdap/Td (3 - Td or Tdap) 11/02/2030   Zoster Vaccines- Shingrix  Completed   HPV VACCINES  Aged Out    Discussed health  benefits of physical activity, and encouraged her to engage in regular exercise appropriate for her age and condition.  Routine adult health maintenance Normal physical exam findings today, I reviewed her HM, orderes labs for annual surveillance, handouts given on healthy eating and exercise, counseled patient on healthy sleep habits and increasing exercise weekly.   -     CBC with Differential/Platelet; Future -     Comprehensive metabolic panel; Future  Anxiety -  Dicyclomine HCl; Take 1 tablet (20 mg total) by mouth 3 (three) times daily as needed. stress  Dispense: 270 tablet; Refill: 1 -     Metoprolol Succinate ER; Take 1 tablet (100 mg total) by mouth daily. Take with or immediately following a meal.  Dispense: 90 tablet; Refill: 1 -     LORazepam; Take 1 tablet (0.5 mg total) by mouth 2 (two) times daily as needed.  Dispense: 30 tablet; Refill: 0  Lipid screening -     Lipid panel; Future  Anxiety state Assessment & Plan: On metoprolol 100 mg daily, only taking the lorazepam about 5-6 times per month. We did discuss the high risk nature of the lorazepam and I advised her to continue using it sparingly. Will refill the lorazepam as well as the metoprolol today.      Return in about 6 months (around 03/07/2024) for refills of medication.     Karie Georges, MD

## 2023-09-10 NOTE — Assessment & Plan Note (Signed)
On metoprolol 100 mg daily, only taking the lorazepam about 5-6 times per month. We did discuss the high risk nature of the lorazepam and I advised her to continue using it sparingly. Will refill the lorazepam as well as the metoprolol today.

## 2023-09-12 ENCOUNTER — Encounter: Payer: Self-pay | Admitting: Family Medicine

## 2023-11-10 ENCOUNTER — Other Ambulatory Visit: Payer: Self-pay | Admitting: Family Medicine

## 2023-11-10 DIAGNOSIS — F419 Anxiety disorder, unspecified: Secondary | ICD-10-CM

## 2023-11-13 ENCOUNTER — Telehealth: Payer: Self-pay

## 2023-11-13 MED ORDER — LORAZEPAM 0.5 MG PO TABS: 0.5000 mg | ORAL_TABLET | Freq: Two times a day (BID) | ORAL | 0 refills | Status: DC | PRN

## 2023-11-14 ENCOUNTER — Other Ambulatory Visit: Payer: Self-pay | Admitting: Family Medicine

## 2023-11-14 DIAGNOSIS — F419 Anxiety disorder, unspecified: Secondary | ICD-10-CM

## 2023-11-14 NOTE — Telephone Encounter (Signed)
 Copied from CRM [id number]. Topic: Clinical - Prescription Issue >> Nov 14, 2023  3:02 PM Kathy Floyd wrote: Reason for CRM: PT CALLED STATED SHE HAS CALLED SINCE MONDAY FOR REFILL ON LORazepam  (ATIVAN ) 0.5 MG tablet AND SHOWING WAS SENT YESTERDAY 04/17 BY CMA BUT WAS DISCONTINUED NOT WHY NO REASON GIVEN. PT IS CONCERNED

## 2023-11-17 NOTE — Telephone Encounter (Signed)
 Which medication ?

## 2023-11-18 ENCOUNTER — Other Ambulatory Visit: Payer: Self-pay | Admitting: Family Medicine

## 2023-11-18 DIAGNOSIS — F419 Anxiety disorder, unspecified: Secondary | ICD-10-CM

## 2023-11-18 MED ORDER — LORAZEPAM 0.5 MG PO TABS: 0.5000 mg | ORAL_TABLET | Freq: Two times a day (BID) | ORAL | 0 refills | Status: DC | PRN

## 2023-11-18 NOTE — Telephone Encounter (Signed)
 Already sent on 4/17, did it make it there to the pharmacy?

## 2023-11-18 NOTE — Telephone Encounter (Signed)
 Ok it could've went through. I sent it when she asked for it. I was following up but did not check to see if it was sent. Thanks for the update.

## 2023-11-18 NOTE — Telephone Encounter (Signed)
 I apologize its for LORazepam  (ATIVAN ) 0.5 MG tablet

## 2024-03-08 ENCOUNTER — Encounter: Payer: Self-pay | Admitting: Family Medicine

## 2024-03-08 ENCOUNTER — Ambulatory Visit (INDEPENDENT_AMBULATORY_CARE_PROVIDER_SITE_OTHER): Payer: BC Managed Care – PPO | Admitting: Family Medicine

## 2024-03-08 VITALS — BP 102/60 | HR 70 | Temp 98.3°F | Wt 180.7 lb

## 2024-03-08 DIAGNOSIS — E559 Vitamin D deficiency, unspecified: Secondary | ICD-10-CM | POA: Insufficient documentation

## 2024-03-08 DIAGNOSIS — K625 Hemorrhage of anus and rectum: Secondary | ICD-10-CM

## 2024-03-08 DIAGNOSIS — F419 Anxiety disorder, unspecified: Secondary | ICD-10-CM

## 2024-03-08 LAB — VITAMIN D 25 HYDROXY (VIT D DEFICIENCY, FRACTURES): VITD: 28.84 ng/mL — ABNORMAL LOW (ref 30.00–100.00)

## 2024-03-08 MED ORDER — METOPROLOL SUCCINATE ER 100 MG PO TB24: 100.0000 mg | ORAL_TABLET | Freq: Every day | ORAL | 1 refills | Status: AC

## 2024-03-08 MED ORDER — LORAZEPAM 0.5 MG PO TABS: 0.5000 mg | ORAL_TABLET | Freq: Two times a day (BID) | ORAL | 0 refills | Status: AC | PRN

## 2024-03-08 NOTE — Patient Instructions (Addendum)
 Fiber -- 35 grams of fiber per day Increase hydration -- drink lots of water per day Stool softener over the counter as needed.   [email or web address]  Www.[email or web address]  Www.[employer].com  [place] -- semaglutide or [employer]

## 2024-03-08 NOTE — Progress Notes (Signed)
 Established Patient Office Visit  Subjective   Patient ID: Kathy Floyd, female    DOB: 1961/04/12  Age: 63 y.o. MRN: [id number]  Chief Complaint  Patient presents with   Medical Management of Chronic Issues   Requests test for vitamin D    Rectal Bleeding    Patient complains of bloody stools for the past 2 years-4th episode and noticed twice last month   Medication Refill    Requests refill on Lorazepam  and Metoprolol     Pt is here for 6 month follow up today. She reports she is taking the lorazepam  about 5-6 times per month. I reviewed her PDMP  refill history.   Pt reports she had 2 episodes of rectal bleeding in the last month. This has happened to her before in the past, states it usually happens with a hard stool, that it hurts when it comes out. States that in between she does not have any issues. Last colonoscopy was in 2022, she had small diverticula but otherwise was a normal scope.   Anxiety -  pt reports good control of her anxiety symptoms with the metoprolol  and lorazepam  as needed. We discussed again about the high risk nature of the medication. She states she is monitoring her intake and voiced understanding about the chronic use of benzo's and their risk. Pt is also on metoprolol  100 mg daily for the anxiety as well. No new stressors of note.    Current Outpatient Medications  Medication Instructions   Ascorbic Acid (VITAMIN C PO) 500 mg, Daily   dicyclomine  (BENTYL ) 20 mg, Oral, 3 times daily PRN, stress   diphenhydrAMINE (BENADRYL) 12.5 mg, At bedtime PRN   fish oil-omega-3 fatty acids 1,500 mg, Daily   LORazepam  (ATIVAN ) 0.5 mg, Oral, 2 times daily PRN   LYSINE PO 1 tablet, As needed   metoprolol  succinate (TOPROL -XL) 100 mg, Oral, Daily, Take with or immediately following a meal.   Multiple Vitamin (MULTIVITAMIN) tablet 1 tablet, Daily    Patient Active Problem List   Diagnosis Date Noted   Vitamin D  deficiency 03/08/2024   HIP PAIN, RIGHT, CHRONIC  06/06/2010   Anxiety state 04/27/2008      Review of Systems  All other systems reviewed and are negative.     Objective:     BP 102/60   Pulse 70   Temp 98.3 F (36.8 C) (Oral)   Wt 180 lb 11.2 oz (82 kg)   LMP 06/28/2012   SpO2 96%   BMI 30.78 kg/m    Physical Exam Vitals reviewed.  Constitutional:      Appearance: Normal appearance. She is well-groomed and normal weight.  Cardiovascular:     Rate and Rhythm: Normal rate and regular rhythm.     Pulses: Normal pulses.     Heart sounds: S1 normal and S2 normal.  Pulmonary:     Effort: Pulmonary effort is normal.     Breath sounds: Normal breath sounds and air entry.  Musculoskeletal:     Right lower leg: No edema.     Left lower leg: No edema.  Neurological:     Mental Status: She is alert and oriented to person, place, and time. Mental status is at baseline.     Gait: Gait is intact.  Psychiatric:        Mood and Affect: Mood and affect normal.        Speech: Speech normal.        Behavior: Behavior normal.  Judgment: Judgment normal.      The 10-year ASCVD risk score (Arnett DK, et al., 2019) is: 3.2%    Assessment & Plan:  Vitamin D  deficiency Assessment & Plan: History of, needs new level checked in follow up today  Orders: -     VITAMIN D  25 Hydroxy (Vit-D Deficiency, Fractures); Future  Anxiety -     Metoprolol  Succinate ER; Take 1 tablet (100 mg total) by mouth daily. Take with or immediately following a meal.  Dispense: 90 tablet; Refill: 1 -     LORazepam ; Take 1 tablet (0.5 mg total) by mouth 2 (two) times daily as needed.  Dispense: 30 tablet; Refill: 0  Bright red rectal bleeding New problem, only happens with hard stool, no discomfort, no rectal pain or lumps that she has noticed. I counseled the patient extensively on chronic constipation and the sequelae it can cause. I wrote out OTC recommendations on the AVS to help with chronic constipation, if she continues to have issues then  we may consider sending her back to GI to follow up.     Return in about 6 months (around 09/08/2024) for annual physical exam.    Heron CHRISTELLA Sharper, MD

## 2024-03-09 ENCOUNTER — Ambulatory Visit: Payer: Self-pay | Admitting: Family Medicine

## 2024-03-10 NOTE — Assessment & Plan Note (Signed)
 History of, needs new level checked in follow up today

## 2024-06-09 ENCOUNTER — Ambulatory Visit (INDEPENDENT_AMBULATORY_CARE_PROVIDER_SITE_OTHER): Payer: PRIVATE HEALTH INSURANCE | Admitting: Family Medicine

## 2024-06-09 ENCOUNTER — Ambulatory Visit (INDEPENDENT_AMBULATORY_CARE_PROVIDER_SITE_OTHER): Payer: PRIVATE HEALTH INSURANCE

## 2024-06-09 ENCOUNTER — Encounter: Payer: Self-pay | Admitting: Family Medicine

## 2024-06-09 VITALS — BP 98/60 | HR 65 | Temp 98.2°F | Ht 64.25 in | Wt 169.6 lb

## 2024-06-09 DIAGNOSIS — M25811 Other specified joint disorders, right shoulder: Secondary | ICD-10-CM

## 2024-06-09 LAB — URIC ACID: Uric Acid, Serum: 6.3 mg/dL (ref 2.4–7.0)

## 2024-06-09 NOTE — Progress Notes (Signed)
 Acute Office Visit  Subjective:     Patient ID: Kathy Floyd, female    DOB: Aug 21, 1960, 63 y.o.   MRN: [id number]  Chief Complaint  Patient presents with   Mass    Patient complains of mass noted right clavicle x2 days, no known injury    HPI Discussed the use of AI scribe software for clinical note transcription with the patient, who gave verbal consent to proceed.  History of Present Illness   Kathy Floyd is a 62 year old female who presents with a hard, swollen lump near the clavicle.  A hard, swollen lump near the clavicle was noticed a couple of days ago. Initially softer, it has since become hard. The lump is not tender, red, or itchy. There are no associated symptoms such as fever, chills, chest pain, or shortness of breath. No recent falls or injuries.  She recently resumed gym activities after a week off, engaging in weight lifting of about twenty-five pounds. No pain or unusual sensations in the neck.  She is on a weight loss regimen and has lost about thirteen pounds over the past eight weeks, feeling good and full from the treatment.       Review of Systems  Constitutional:  Positive for weight loss (currently on zepbound for weight loss). Negative for chills, diaphoresis, fever and malaise/fatigue.  Respiratory:  Negative for cough and shortness of breath.   Cardiovascular:  Negative for chest pain and palpitations.  Gastrointestinal:  Negative for nausea and vomiting.  Genitourinary:  Negative for hematuria.  Musculoskeletal:  Negative for joint pain and myalgias.  Skin:  Negative for itching and rash.        Objective:    BP 98/60   Pulse 65   Temp 98.2 F (36.8 C) (Oral)   Ht 5' 4.25 (1.632 m)   Wt 169 lb 9.6 oz (76.9 kg)   LMP 06/28/2012   SpO2 100%   BMI 28.89 kg/m    Physical Exam Vitals reviewed.  Constitutional:      Appearance: Normal appearance. She is normal weight.  HENT:     Mouth/Throat:     Mouth: Mucous  membranes are moist.     Pharynx: No posterior oropharyngeal erythema.  Cardiovascular:     Rate and Rhythm: Normal rate and regular rhythm.     Heart sounds: Normal heart sounds. No murmur heard. Pulmonary:     Effort: Pulmonary effort is normal.     Breath sounds: Normal breath sounds. No wheezing, rhonchi or rales.  Chest:     Chest wall: No tenderness.  Musculoskeletal:     Cervical back: Normal range of motion. No rigidity or tenderness.  Lymphadenopathy:     Cervical: No cervical adenopathy.  Neurological:     Mental Status: She is alert.     No results found for any visits on 06/09/24.      Assessment & Plan:   Problem List Items Addressed This Visit   None Visit Diagnoses       Enlargement of right sternoclavicular joint    -  Primary   Relevant Orders   DG Clavicle Right   Uric Acid   Rheumatoid factor     Assessment and Plan    Swelling at right sternoclavicular joint Swelling at the right sternoclavicular joint, noticed a few days ago. The swelling is not hard, non-tender, non-red, and non-warm. Differential diagnosis includes arthritis, synovial cyst, effusion, or lipoma. Low suspicion for malignancy due to  lack of systemic symptoms and benign characteristics. Recent weight loss may have revealed a previously unnoticed lipoma. - Ordered x-ray of the clavicle to assess the joint and rule out bone lesions. - Ordered rheumatoid factor and uric acid level to evaluate for inflammatory arthritis or gout. - Will consider targeted ultrasound if x-ray and blood tests are inconclusive.  Weight loss therapy, ongoing Ongoing weight loss therapy with injections. She has lost 13 pounds and reports feeling well with the treatment. The therapy is contributing to the reduction of subcutaneous fat, which may have revealed a previously unnoticed lipoma. - Continue current weight loss therapy with injections.        No orders of the defined types were placed in this  encounter.   No follow-ups on file.  Heron CHRISTELLA Sharper, MD

## 2024-06-10 ENCOUNTER — Ambulatory Visit: Payer: Self-pay | Admitting: Family Medicine

## 2024-06-10 LAB — RHEUMATOID FACTOR: Rheumatoid fact SerPl-aCnc: <10 [IU]/mL (ref <14)

## 2024-09-09 ENCOUNTER — Encounter: Payer: Self-pay | Admitting: Family Medicine
